# Patient Record
Sex: Male | Born: 1960 | Race: White | Hispanic: No | Marital: Married | Smoking: Never smoker
Health system: Southern US, Community
[De-identification: ages and names within clinical notes are randomized; demographics above are authoritative.]

---

## 2005-07-24 ENCOUNTER — Encounter: Admission: RE | Admit: 2005-07-24 | Discharge: 2005-07-24 | Payer: Self-pay | Admitting: Neurology

## 2005-12-15 ENCOUNTER — Emergency Department: Payer: Self-pay | Admitting: General Practice

## 2006-11-21 IMAGING — CR DG HUMERUS 2V *R*
3 series · 3 of 3 positions shown · non-contrast
Comparison: none

CLINICAL DATA: Pain in the upper arm, some numbness.
 RIGHT HUMERUS:
 Three views of the right humerus were obtained, including views of the elbow.  No acute bony abnormality is seen.  There is a small exostosis emanating from the distal right humerus.  There is mild degenerative change in the elbow.

[view not recorded (1 of 3)]
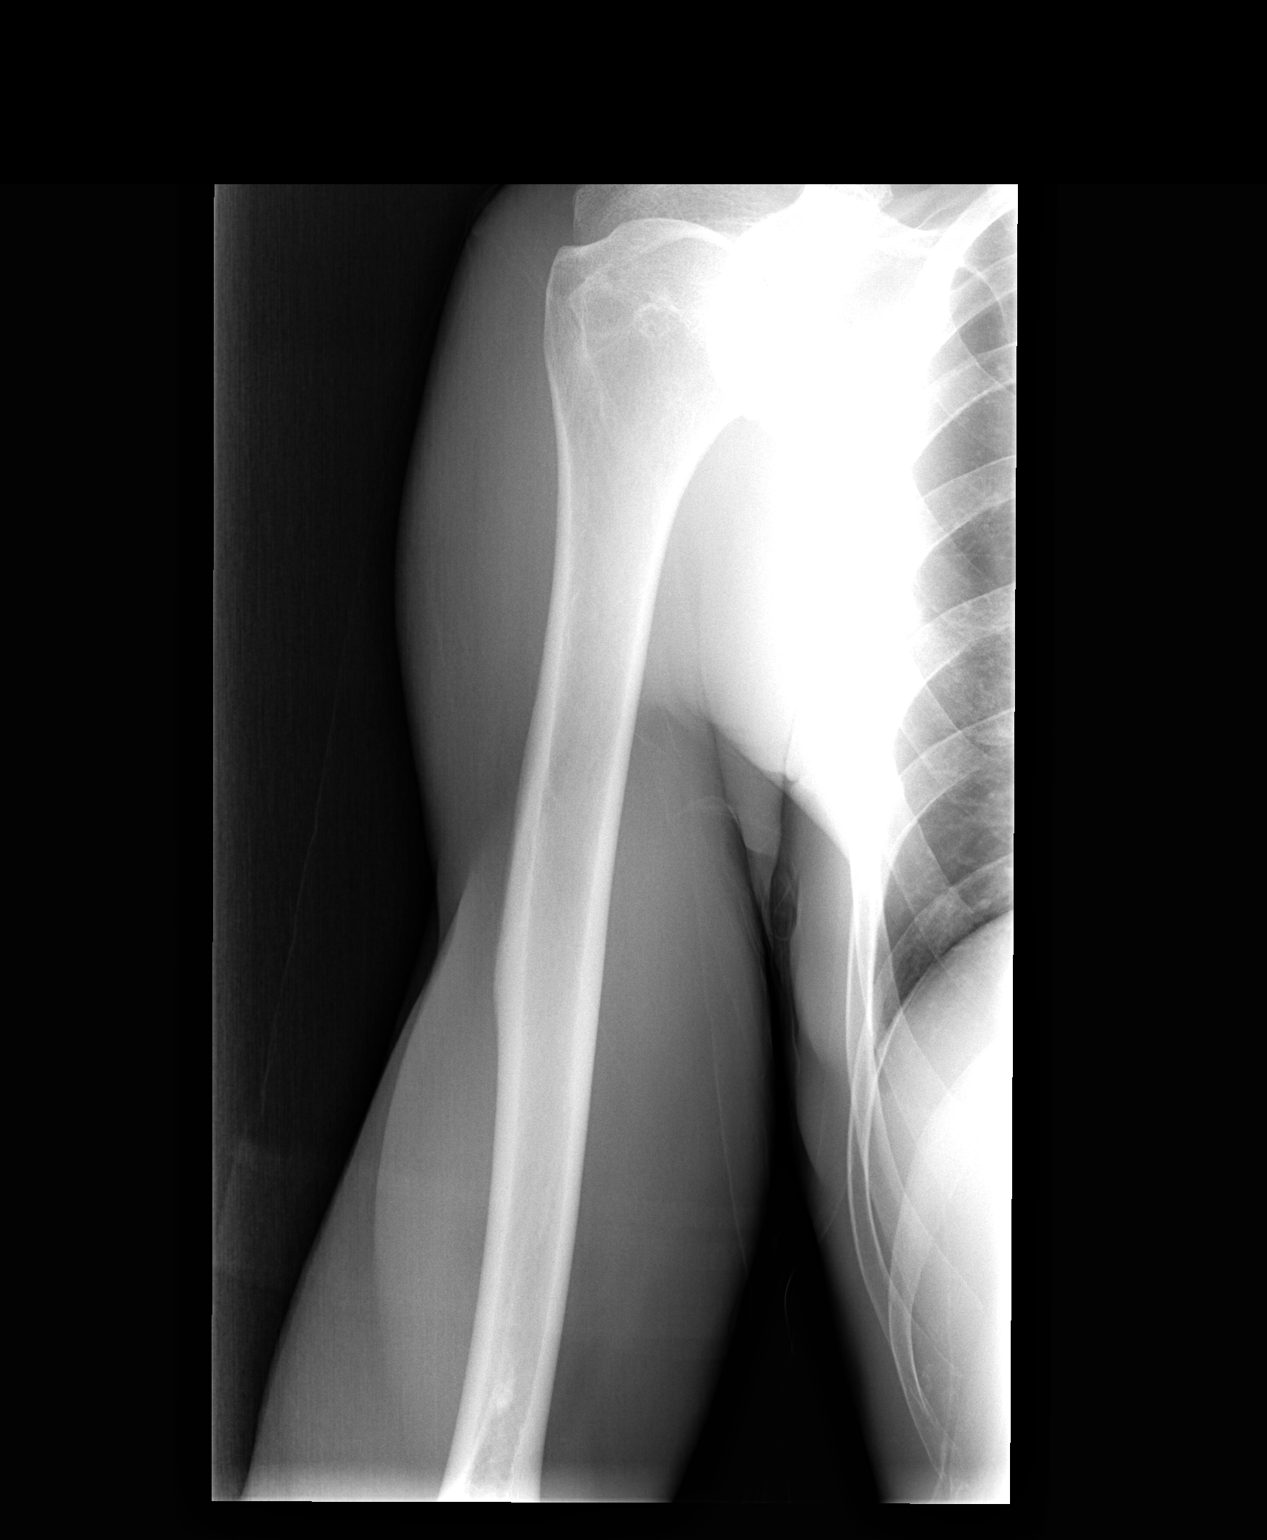

[view not recorded (2 of 3)]
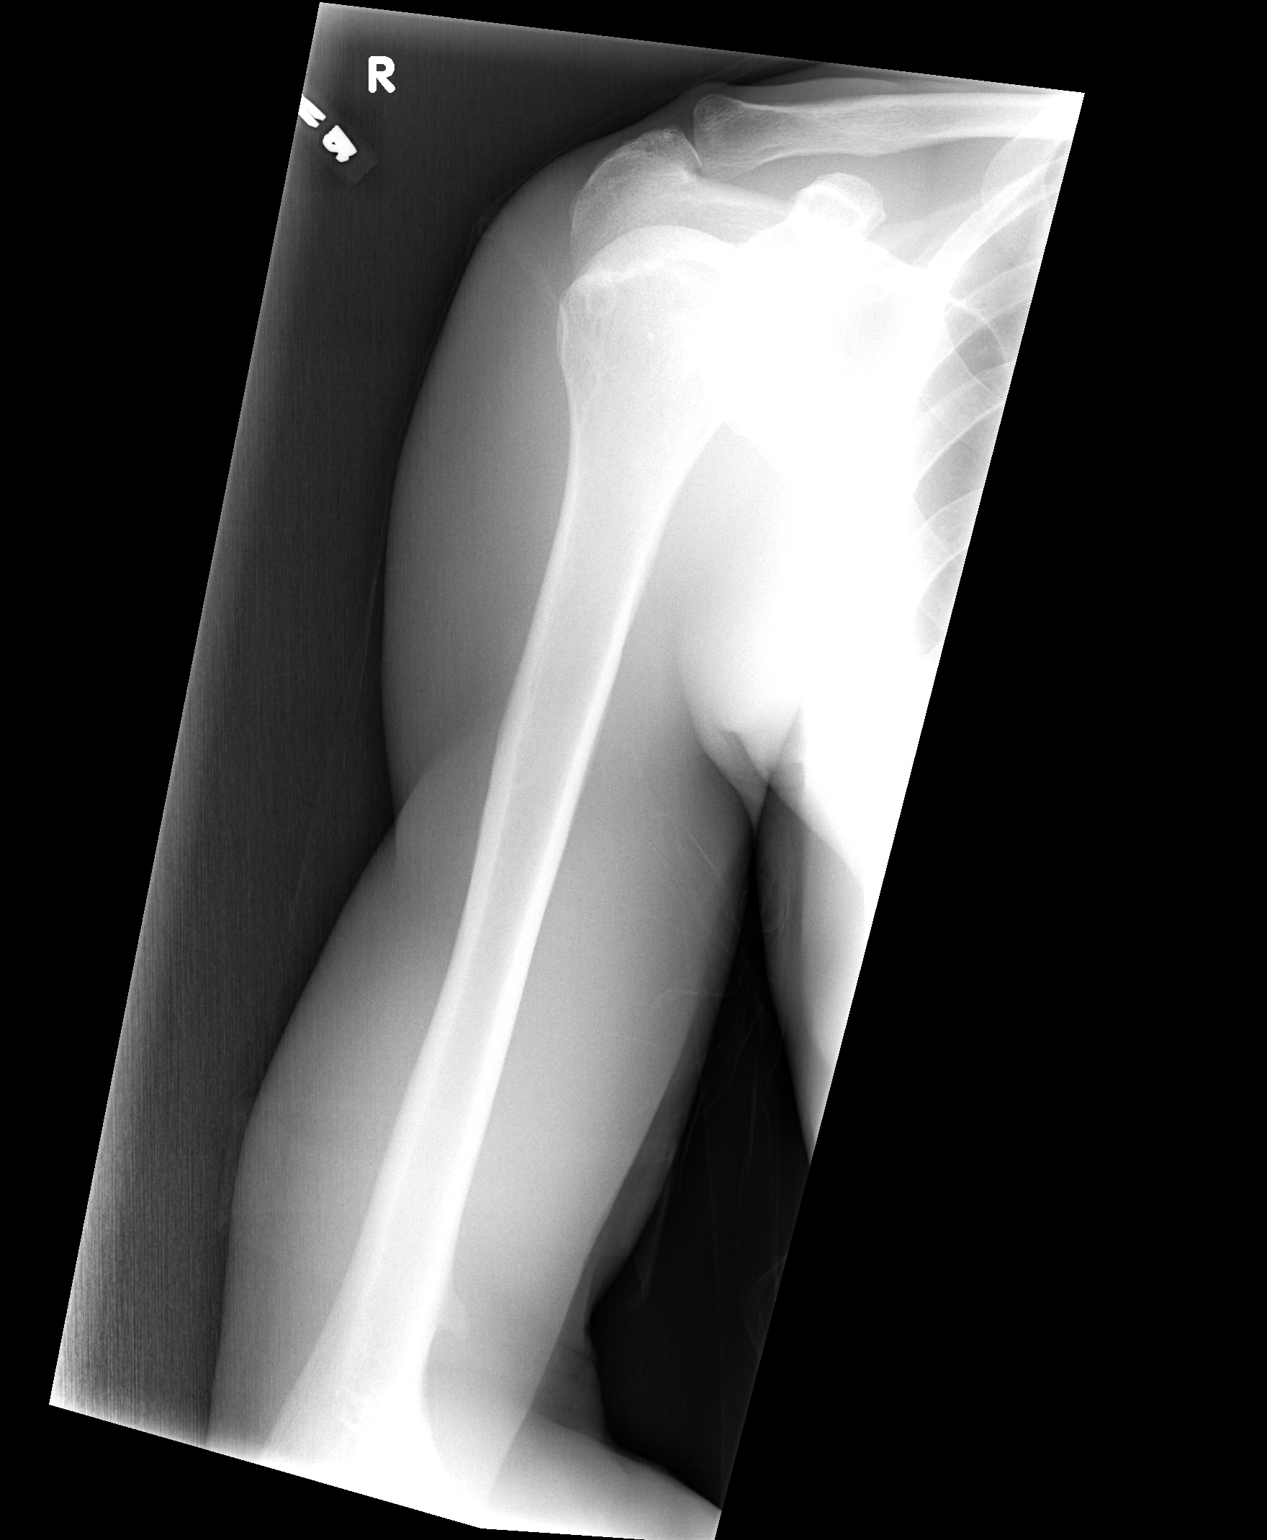

[view not recorded (3 of 3)]
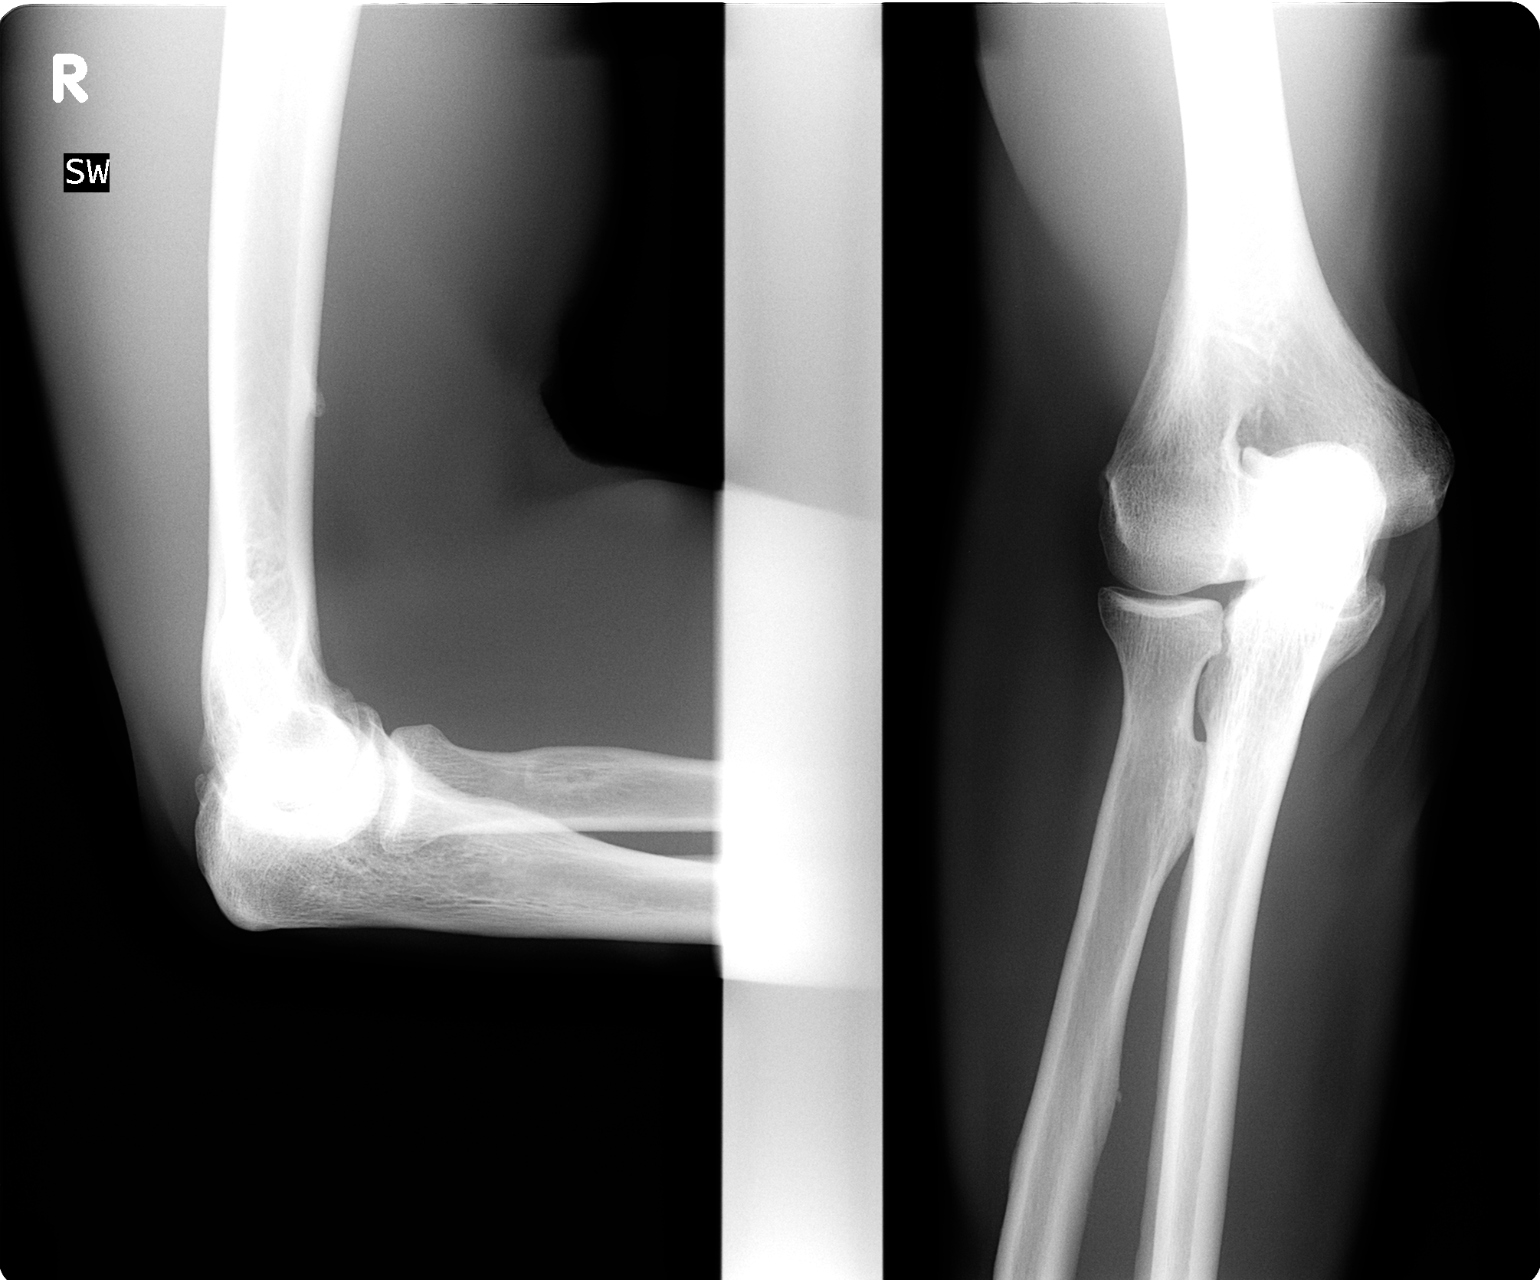

[3 of 3 positions shown; findings below may reference images not displayed]

IMPRESSION: Small bony exostosis emanates from the distal right humerus.  Mild degenerative change in the right elbow is noted.

## 2007-04-14 IMAGING — CR DG ANKLE COMPLETE 3+V*L*
1 series · 5 of 5 positions shown · non-contrast
Comparison: none

REASON FOR EXAM: Running injury - twisted
COMMENTS:

[Series 1: view not recorded · 0.17mm/px · 5 of 5 slices shown]
[im 1/5]
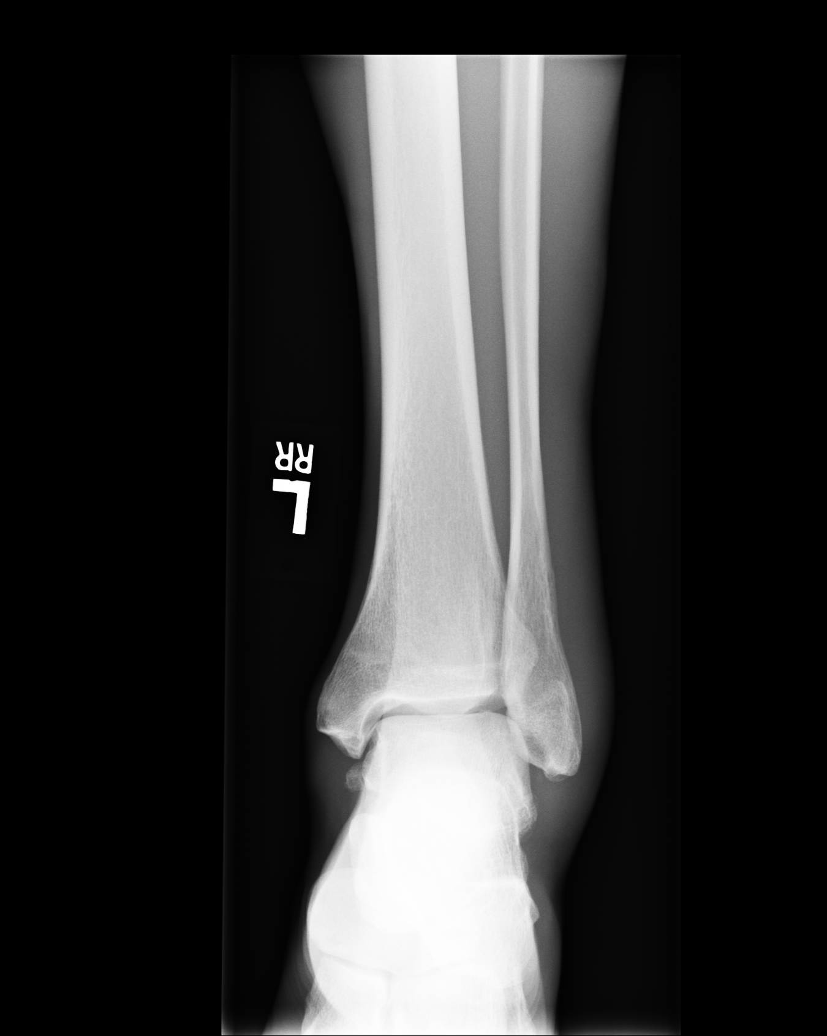
[im 2/5]
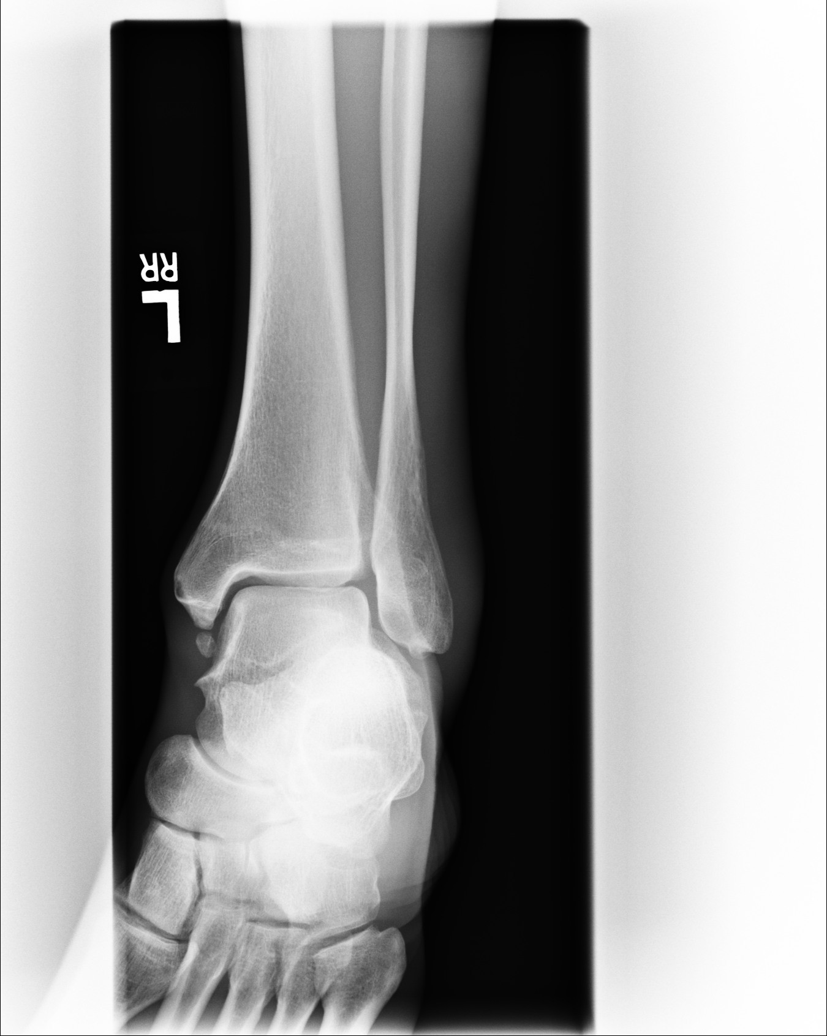
[im 3/5]
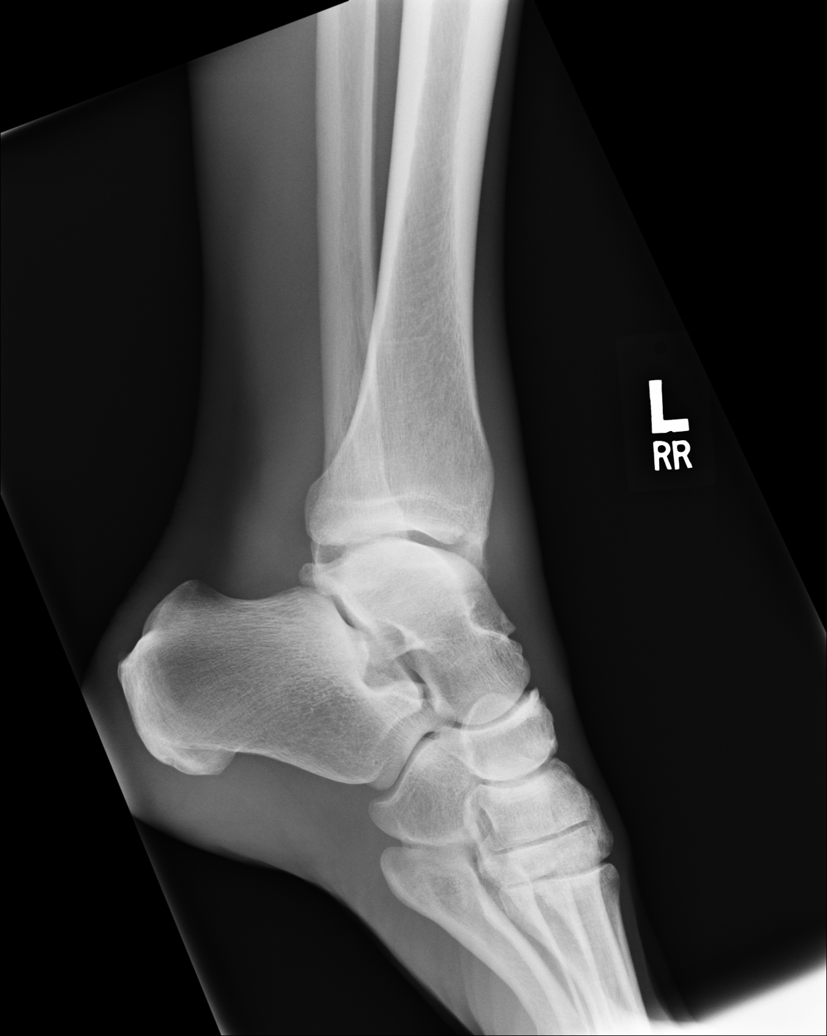
[im 4/5]
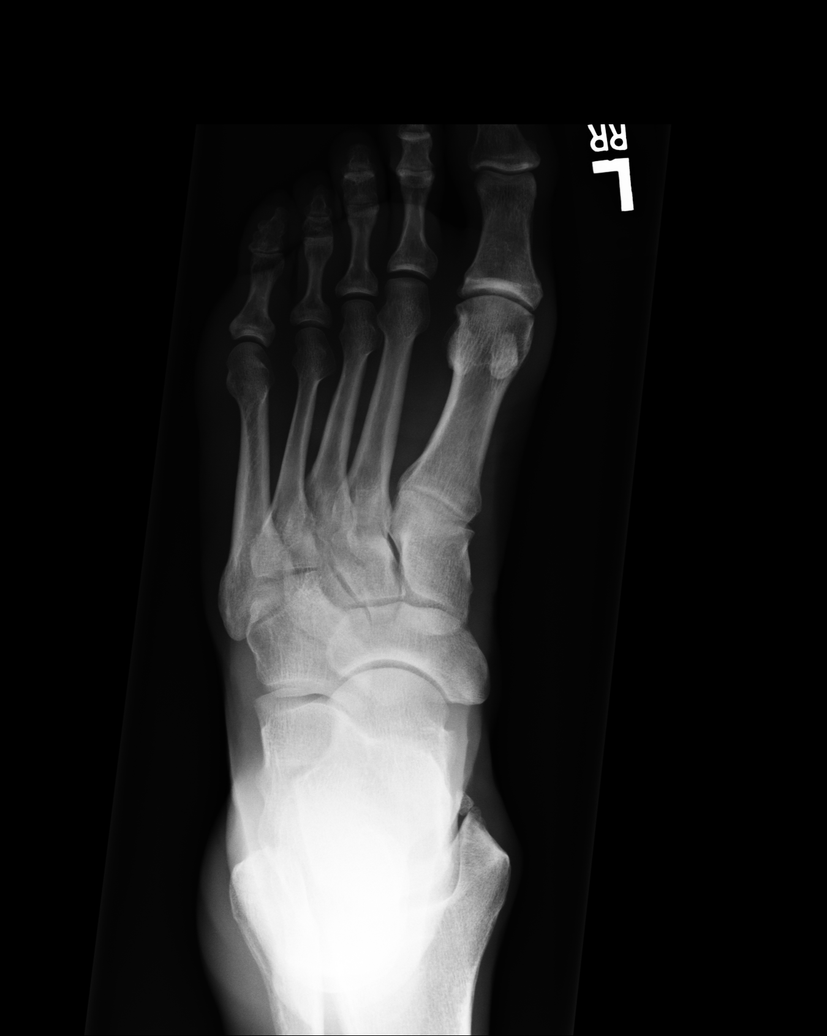
[im 5/5]
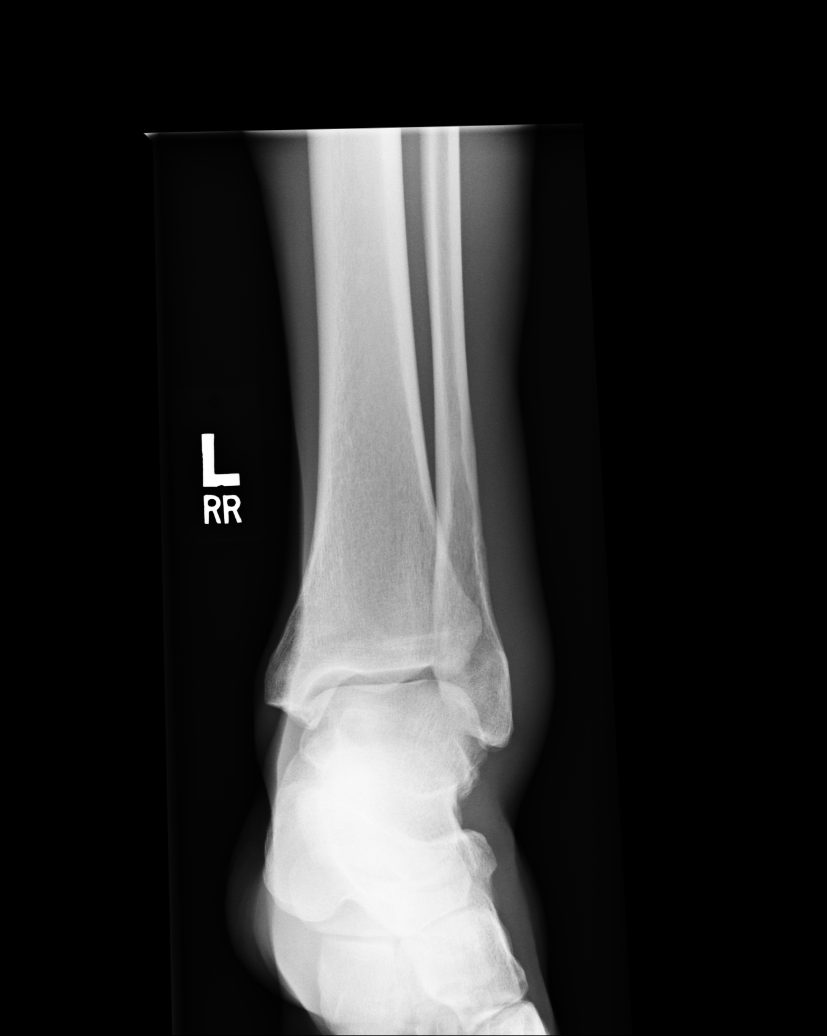

[5 of 5 positions shown; findings below may reference images not displayed]

PROCEDURE:     DXR - DXR ANKLE LEFT COMPLETE  - December 15, 2005  [DATE]

RESULT:       There is soft tissue swelling over the lateral malleolus.  The
ankle joint mortise is preserved.  There is bony density inferior to the
medial malleolus that likely reflects an old avulsion versus an accessory
ossicles.  The posterior malleolus appears intact.  The ankle joint mortise
is preserved.
IMPRESSION: 1.     I do not see evidence of acute fracture of the LEFT ankle.  There is
soft tissue swelling, however, which may indicate ligamentous injury.
Follow-up films are recommended if the patient's symptoms persist and do not
resolve in a manner consistent with an uncomplicated sprain.
2.     There is likely an old avulsion fracture along the medial malleolus.

## 2008-08-04 DIAGNOSIS — L570 Actinic keratosis: Secondary | ICD-10-CM

## 2008-08-04 HISTORY — DX: Actinic keratosis: L57.0

## 2013-10-21 LAB — HEPATIC FUNCTION PANEL
ALK PHOS: 51 U/L (ref 25–125)
ALT: 23 U/L (ref 10–40)
AST: 25 U/L (ref 14–40)
BILIRUBIN, TOTAL: 0.4 mg/dL

## 2013-10-21 LAB — BASIC METABOLIC PANEL
BUN: 21 mg/dL (ref 4–21)
Creatinine: 1 mg/dL (ref 0.6–1.3)
GLUCOSE: 84 mg/dL
POTASSIUM: 5.1 mmol/L (ref 3.4–5.3)
SODIUM: 139 mmol/L (ref 137–147)

## 2013-10-21 LAB — CBC AND DIFFERENTIAL
HCT: 42 % (ref 41–53)
HEMOGLOBIN: 14.8 g/dL (ref 13.5–17.5)
Neutrophils Absolute: 3 /uL
Platelets: 189 10*3/uL (ref 150–399)
WBC: 5.7 10^3/mL

## 2013-10-21 LAB — TSH: TSH: 2.83 u[IU]/mL (ref 0.41–5.90)

## 2013-10-21 LAB — PSA: PSA: 0.3

## 2013-10-21 LAB — LIPID PANEL
CHOLESTEROL: 175 mg/dL (ref 0–200)
HDL: 77 mg/dL — AB (ref 35–70)
LDL Cholesterol: 86 mg/dL
LDL/HDL RATIO: 1.1
TRIGLYCERIDES: 58 mg/dL (ref 40–160)

## 2014-01-03 ENCOUNTER — Ambulatory Visit: Payer: Self-pay | Admitting: Unknown Physician Specialty

## 2014-01-03 LAB — HM COLONOSCOPY

## 2014-01-05 LAB — PATHOLOGY REPORT

## 2014-10-08 DIAGNOSIS — F329 Major depressive disorder, single episode, unspecified: Secondary | ICD-10-CM | POA: Insufficient documentation

## 2014-10-08 DIAGNOSIS — L719 Rosacea, unspecified: Secondary | ICD-10-CM | POA: Insufficient documentation

## 2014-10-08 DIAGNOSIS — Z789 Other specified health status: Secondary | ICD-10-CM | POA: Insufficient documentation

## 2014-10-08 DIAGNOSIS — F32A Depression, unspecified: Secondary | ICD-10-CM | POA: Insufficient documentation

## 2014-10-08 DIAGNOSIS — E559 Vitamin D deficiency, unspecified: Secondary | ICD-10-CM | POA: Insufficient documentation

## 2014-10-08 DIAGNOSIS — K219 Gastro-esophageal reflux disease without esophagitis: Secondary | ICD-10-CM | POA: Insufficient documentation

## 2014-11-10 ENCOUNTER — Encounter: Payer: Self-pay | Admitting: Family Medicine

## 2014-11-25 ENCOUNTER — Ambulatory Visit (INDEPENDENT_AMBULATORY_CARE_PROVIDER_SITE_OTHER): Payer: BLUE CROSS/BLUE SHIELD | Admitting: Family Medicine

## 2014-11-25 ENCOUNTER — Encounter: Payer: Self-pay | Admitting: Family Medicine

## 2014-11-25 VITALS — BP 98/58 | HR 60 | Temp 97.8°F | Resp 16 | Ht 72.0 in | Wt 166.0 lb

## 2014-11-25 DIAGNOSIS — Z0000 Encounter for general adult medical examination without abnormal findings: Secondary | ICD-10-CM | POA: Diagnosis not present

## 2014-11-25 NOTE — Progress Notes (Signed)
Patient ID: Johnny Serrano, male   DOB: Feb 26, 1961, 54 y.o.   MRN: [id number]       Patient: Johnny Serrano, Male    DOB: 1961/02/04, 54 y.o.   MRN: [id number] Visit Date: 11/25/2014  Today's Provider: Wilhemena Durie, MD   Chief Complaint  Patient presents with  . Annual Exam   Subjective:    Annual physical exam Johnny Serrano is a 54 y.o. male who presents today for health maintenance and complete physical. He feels well. He reports exercising daily, usually running. He reports he is sleeping well.  ----------------------------------------------------------------- Colonoscopy-01/03/14 Polyps, diverticulosis, hemorrhoids EKG 01/03/11 Tdap 10/20/13    Review of Systems  Constitutional: Negative.   HENT: Negative.   Eyes: Negative.   Respiratory: Negative.   Cardiovascular: Negative.   Gastrointestinal: Negative.   Endocrine: Negative.   Genitourinary: Negative.   Musculoskeletal: Negative.   Skin: Negative.   Allergic/Immunologic: Negative.   Neurological: Negative.   Hematological: Negative.   Psychiatric/Behavioral: Negative.   All other systems reviewed and are negative.   Social History He   Social History   Social History  . Marital Status: Married    Spouse Name: N/A  . Number of Children: N/A  . Years of Education: N/A   Social History Main Topics  . Smoking status: Not on file  . Smokeless tobacco: Not on file  . Alcohol Use: Not on file  . Drug Use: Not on file  . Sexual Activity: Not on file   Other Topics Concern  . Not on file   Social History Narrative  . No narrative on file    Patient Active Problem List   Diagnosis Date Noted  . Problems influencing health status 10/08/2014  . Clinical depression 10/08/2014  . Acid reflux 10/08/2014  . 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 Avitaminosis D 10/08/2014    No past surgical history on file.  Family History  Family Status  Relation Status Death Age  . Mother Alive   . Father  Alive   . Sister Alive   . Daughter Alive   . Son Alive   . Daughter Alive    His family history includes Asthma in his daughter; Bradycardia in his son; COPD in his father; Hypotension in his father; Iron deficiency in his daughter; Osteoporosis in his father.    Allergies  Allergen Reactions  . Nsaids   . Shellfish Allergy Swelling    Can eat cooked shellfish but any raw shellfish causes swelling to area in contact    Previous Medications   CHOLECALCIFEROL 1000 UNITS CAPSULE    Take by mouth.   ESCITALOPRAM (LEXAPRO) 10 MG TABLET    Take by mouth.   IBUPROFEN (ADVIL) 200 MG TABLET    Take by mouth.   METRONIDAZOLE (METROGEL) 1 % GEL    METROGEL, 1% (External Gel)  1 as needed for 0 days  Refills: 12   Ordered :17-Apr-2012  Althea Charon ;  Started 17-Apr-2012 Active Comments: Medication taken as needed.     Patient Care Team: Jerrol Banana., MD as PCP - General (Family Medicine)     Objective:   Vitals: BP 98/58 mmHg  Pulse 60  Temp(Src) 97.8 F (36.6 C) (Oral)  Resp 16  Ht 6' (1.829 m)  Wt 166 lb (75.297 kg)  BMI 22.51 kg/m2   Physical Exam  Constitutional: He is oriented to person, place, and time. He appears well-developed and well-nourished.  HENT:  Head: Normocephalic and atraumatic.  Right Ear: External ear normal.  Left Ear: External ear normal.  Nose: Nose normal.  Mouth/Throat: Oropharynx is clear and moist.  Eyes: Conjunctivae and EOM are normal. Pupils are equal, round, and reactive to light.  Neck: Neck supple.  Cardiovascular: Normal rate, regular rhythm and normal heart sounds.   Pulmonary/Chest: Effort normal and breath sounds normal.  Abdominal: Soft. Bowel sounds are normal.  Genitourinary: Prostate normal and penis normal.  Musculoskeletal: Normal range of motion.  Neurological: He is alert and oriented to person, place, and time.  Skin: Skin is warm and dry.  Psychiatric: He has a normal mood and affect. His behavior is normal.  Judgment and thought content normal.   Patient had colonoscopy October of last year. DRE deferred until next year at [facility]   Depression Screen No flowsheet data found.    Assessment & Plan:     Routine Health Maintenance and Physical Exam  Exercise Activities and Dietary recommendations Goals    None      Immunization History  Administered Date(s) Administered  . Tdap 10/20/2013    Health Maintenance  Topic Date Due  . Hepatitis C Screening  07-17-1960  . HIV Screening  02/05/1974  . INFLUENZA VACCINE  10/31/2014  . TETANUS/TDAP  10/21/2023  . COLONOSCOPY  01/04/2024   I have done the exam and reviewed the above chart and it is accurate to the best of my knowledge.    Discussed health benefits of physical activity, and encouraged him to engage in regular exercise appropriate for his age and condition.    --------------------------------------------------------------------

## 2014-11-26 LAB — CBC WITH DIFFERENTIAL/PLATELET
BASOS: 1 %
Basophils Absolute: 0 10*3/uL (ref 0.0–0.2)
EOS (ABSOLUTE): 0.2 10*3/uL (ref 0.0–0.4)
EOS: 4 %
HEMATOCRIT: 42.6 % (ref 37.5–51.0)
Hemoglobin: 14.4 g/dL (ref 12.6–17.7)
Immature Grans (Abs): 0 10*3/uL (ref 0.0–0.1)
Immature Granulocytes: 0 %
LYMPHS ABS: 1.7 10*3/uL (ref 0.7–3.1)
Lymphs: 39 %
MCH: 30.7 pg (ref 26.6–33.0)
MCHC: 33.8 g/dL (ref 31.5–35.7)
MCV: 91 fL (ref 79–97)
MONOS ABS: 0.2 10*3/uL (ref 0.1–0.9)
Monocytes: 6 %
Neutrophils Absolute: 2.2 10*3/uL (ref 1.4–7.0)
Neutrophils: 50 %
Platelets: 190 10*3/uL (ref 150–379)
RBC: 4.69 x10E6/uL (ref 4.14–5.80)
RDW: 13.1 % (ref 12.3–15.4)
WBC: 4.4 10*3/uL (ref 3.4–10.8)

## 2014-11-26 LAB — LIPID PANEL WITH LDL/HDL RATIO
Cholesterol, Total: 184 mg/dL (ref 100–199)
HDL: 71 mg/dL (ref >39)
LDL Calculated: 103 mg/dL — ABNORMAL HIGH (ref 0–99)
LDL/HDL RATIO: 1.5 ratio (ref 0.0–3.6)
Triglycerides: 48 mg/dL (ref 0–149)
VLDL Cholesterol Cal: 10 mg/dL (ref 5–40)

## 2014-11-26 LAB — TSH: TSH: 1.94 u[IU]/mL (ref 0.450–4.500)

## 2014-11-26 LAB — PSA: Prostate Specific Ag, Serum: 0.3 ng/mL (ref 0.0–4.0)

## 2014-11-29 ENCOUNTER — Telehealth: Payer: Self-pay

## 2014-11-29 NOTE — Telephone Encounter (Signed)
Pt returned your call.  Thanks, Con Memos

## 2014-11-29 NOTE — Telephone Encounter (Signed)
Left message to call back  

## 2014-11-29 NOTE — Telephone Encounter (Signed)
-----   Message from Jerrol Banana., MD sent at 11/29/2014  1:57 PM EDT ----- Labs good.

## 2014-11-29 NOTE — Telephone Encounter (Signed)
Pt advised, CMET was not done spoke with Mardene Celeste at [facility] and she will get this added on it was their fault.-aa

## 2014-12-01 LAB — COMPREHENSIVE METABOLIC PANEL
A/G RATIO: 2 (ref 1.1–2.5)
ALBUMIN: 4.7 g/dL (ref 3.5–5.5)
ALT: 20 IU/L (ref 0–44)
AST: 19 IU/L (ref 0–40)
Alkaline Phosphatase: 48 IU/L (ref 39–117)
BUN / CREAT RATIO: 18 (ref 9–20)
BUN: 19 mg/dL (ref 6–24)
Bilirubin Total: 0.3 mg/dL (ref 0.0–1.2)
CALCIUM: 9.7 mg/dL (ref 8.7–10.2)
CO2: 25 mmol/L (ref 18–29)
Chloride: 102 mmol/L (ref 97–108)
Creatinine, Ser: 1.07 mg/dL (ref 0.76–1.27)
GFR, EST AFRICAN AMERICAN: 90 mL/min/{1.73_m2} (ref >59)
GFR, EST NON AFRICAN AMERICAN: 78 mL/min/{1.73_m2} (ref >59)
Globulin, Total: 2.3 g/dL (ref 1.5–4.5)
Glucose: 94 mg/dL (ref 65–99)
POTASSIUM: 5.8 mmol/L — AB (ref 3.5–5.2)
Sodium: 144 mmol/L (ref 134–144)
TOTAL PROTEIN: 7 g/dL (ref 6.0–8.5)

## 2014-12-01 LAB — SPECIMEN STATUS REPORT

## 2014-12-01 NOTE — Telephone Encounter (Signed)
[employer], wife of [facility] results-aa

## 2014-12-01 NOTE — Telephone Encounter (Signed)
Pt's wife wanted to know if we had got the other results back yet. Thanks TNP

## 2014-12-20 ENCOUNTER — Other Ambulatory Visit: Payer: Self-pay | Admitting: Family Medicine

## 2015-02-14 ENCOUNTER — Telehealth: Payer: Self-pay | Admitting: *Deleted

## 2015-02-14 NOTE — Telephone Encounter (Signed)
Johnny Serrano returned my call, patient cancelling todays treatment still not feeling well after yesterday's reaction to cehmotherapy infusion, will be back for treatment tomorrow, thanked this RN for calling earlier and checking on her husband 11:21 AM

## 2015-02-15 ENCOUNTER — Telehealth: Payer: Self-pay | Admitting: *Deleted

## 2015-02-15 NOTE — Telephone Encounter (Signed)
Called sons phone number instead of patient, he will ask his mom to give me a call cancelled appt again today

## 2015-02-17 ENCOUNTER — Inpatient Hospital Stay: Admission: RE | Admit: 2015-02-17 | Payer: Self-pay | Source: Ambulatory Visit | Admitting: Radiation Oncology

## 2015-02-17 ENCOUNTER — Ambulatory Visit: Payer: Self-pay | Admitting: Radiation Oncology

## 2015-03-28 ENCOUNTER — Other Ambulatory Visit: Payer: Self-pay | Admitting: Family Medicine

## 2015-08-31 DIAGNOSIS — D239 Other benign neoplasm of skin, unspecified: Secondary | ICD-10-CM

## 2015-08-31 HISTORY — DX: Other benign neoplasm of skin, unspecified: D23.9

## 2015-11-23 ENCOUNTER — Encounter: Payer: Self-pay | Admitting: Family Medicine

## 2015-11-23 ENCOUNTER — Ambulatory Visit (INDEPENDENT_AMBULATORY_CARE_PROVIDER_SITE_OTHER): Payer: BLUE CROSS/BLUE SHIELD | Admitting: Family Medicine

## 2015-11-23 VITALS — BP 116/74 | HR 58 | Temp 97.7°F | Resp 16 | Ht 72.0 in | Wt 176.0 lb

## 2015-11-23 DIAGNOSIS — Z1211 Encounter for screening for malignant neoplasm of colon: Secondary | ICD-10-CM | POA: Diagnosis not present

## 2015-11-23 DIAGNOSIS — Z125 Encounter for screening for malignant neoplasm of prostate: Secondary | ICD-10-CM

## 2015-11-23 DIAGNOSIS — Z0000 Encounter for general adult medical examination without abnormal findings: Secondary | ICD-10-CM | POA: Diagnosis not present

## 2015-11-23 LAB — POCT URINALYSIS DIPSTICK
BILIRUBIN UA: NEGATIVE
GLUCOSE UA: NEGATIVE
Ketones, UA: NEGATIVE
Leukocytes, UA: NEGATIVE
Nitrite, UA: NEGATIVE
Protein, UA: NEGATIVE
RBC UA: NEGATIVE
SPEC GRAV UA: 1.02
Urobilinogen, UA: 0.2
pH, UA: 6.5

## 2015-11-23 LAB — IFOBT (OCCULT BLOOD): IFOBT: NEGATIVE

## 2015-11-23 MED ORDER — IBUPROFEN 800 MG PO TABS: 800.0000 mg | ORAL_TABLET | Freq: Three times a day (TID) | ORAL | 6 refills | Status: DC | PRN

## 2015-11-23 NOTE — Progress Notes (Signed)
Patient: Johnny Serrano, Male    DOB: 1961-01-05, 55 y.o.   MRN: [id number] Visit Date: 11/23/2015  Today's Provider: Wilhemena Durie, MD   Chief Complaint  Patient presents with  . Annual Exam   Subjective:    Annual physical exam SHMUEL KEDROWSKI is a 55 y.o. male who presents today for health maintenance and complete physical. He feels well. He reports exercising every day. He reports he is sleeping well. Patient reports that he does wake up some nights to use the bathroom.   Colonoscopy- 01/03/2014. Benign polyps, hemorrhoids, diverticulosis, otherwise normal.  Hep C screening- 08/03/2012. Negative.     Review of Systems  Constitutional: Negative.   HENT: Negative.   Eyes: Negative.   Respiratory: Negative.   Cardiovascular: Negative.   Gastrointestinal: Negative.   Endocrine: Negative.   Genitourinary: Negative.   Musculoskeletal: Negative.   Skin: Negative.   Allergic/Immunologic: Negative.   Neurological: Negative.   Hematological: Negative.   Psychiatric/Behavioral: Positive for agitation. Negative for behavioral problems, confusion, decreased concentration, dysphoric mood, hallucinations, self-injury, sleep disturbance and suicidal ideas. The patient is not nervous/anxious and is not hyperactive.        Has had increased stress over the last few months, but not concerning.     Social History      He  reports that he has never smoked. He does not have any smokeless tobacco history on file. He reports that he drinks alcohol. He reports that he does not use drugs.       Social History   Social History  . Marital status: Married    Spouse name: N/A  . Number of children: N/A  . Years of education: N/A   Social History Main Topics  . Smoking status: Never Smoker  . Smokeless tobacco: None  . Alcohol use Yes     Comment: 7-8 beers per day. Stopped and now has 2-3 beers daily during the week and 7-8 on the weekends.  . Drug use: No  . Sexual activity:  Not Asked   Other Topics Concern  . None   Social History Narrative  . None    No past medical history on file.   Patient Active Problem List   Diagnosis Date Noted  . Problems influencing health status 10/08/2014  . Clinical depression 10/08/2014  . Acid reflux 10/08/2014  . 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 Avitaminosis D 10/08/2014    No past surgical history on file.  Family History        Family Status  Relation Status  . Mother Alive  . Father Deceased  . Sister Alive  . Daughter Alive  . Son Alive  . Daughter Alive        His family history includes Asthma in his daughter; Bradycardia in his son; COPD in his father; Cancer in his father; Hypotension in his father; Iron deficiency in his daughter; Osteoporosis in his father.    Allergies  Allergen Reactions  . Nsaids   . Shellfish Allergy Swelling    Can eat cooked shellfish but any raw shellfish causes swelling to area in contact    Current Meds  Medication Sig  . Cholecalciferol 1000 UNITS capsule Take by mouth.  . escitalopram (LEXAPRO) 10 MG tablet TAKE 1 TABLET BY MOUTH EVERY DAY  . ibuprofen (ADVIL,MOTRIN) 800 MG tablet Take 1 tablet (800 mg total) by mouth every 8 (eight) hours as needed.  . metroNIDAZOLE (METROGEL) 1 % gel APPLY  A SMALL AMOUNT TO AFFECTED AREA ONCE A DAY AS DIRECTED  . [DISCONTINUED] ibuprofen (ADVIL) 200 MG tablet Take by mouth.    Patient Care Team: Jerrol Banana., MD as PCP - General (Family Medicine)     Objective:   Vitals: BP 116/74 (BP Location: Right Arm, Patient Position: Sitting, Cuff Size: Normal)   Pulse (!) 58   Temp 97.7 F (36.5 C)   Resp 16   Ht 6' (1.829 m)   Wt 176 lb (79.8 kg)   BMI 23.87 kg/m    Physical Exam  Constitutional: He is oriented to person, place, and time. He appears well-developed and well-nourished.  HENT:  Head: Normocephalic and atraumatic.  Right Ear: External ear normal.  Left Ear: External ear normal.  Nose: Nose  normal.  Mouth/Throat: Oropharynx is clear and moist.  Eyes: Conjunctivae are normal. No scleral icterus.  Neck: Neck supple. No JVD present. No thyromegaly present.  Cardiovascular: Normal rate, regular rhythm, normal heart sounds and intact distal pulses.   Pulmonary/Chest: Effort normal and breath sounds normal.  Abdominal: Soft.  Genitourinary: Rectum normal, prostate normal and penis normal. Rectal exam shows guaiac negative stool.  Musculoskeletal: Normal range of motion. He exhibits no edema.  Lymphadenopathy:    He has no cervical adenopathy.  Neurological: He is alert and oriented to person, place, and time. No cranial nerve deficit. He exhibits normal muscle tone. Coordination normal.  Skin: Skin is warm and dry.  Psychiatric: He has a normal mood and affect. His behavior is normal. Judgment and thought content normal.       Assessment & Plan:     Routine Health Maintenance and Physical Exam  Exercise Activities and Dietary recommendations Goals    None      Immunization History  Administered Date(s) Administered  . Tdap 10/20/2013    Health Maintenance  Topic Date Due  . HIV Screening  02/05/1974  . INFLUENZA VACCINE  10/31/2015  . TETANUS/TDAP  10/21/2023  . COLONOSCOPY  01/04/2024  . Hepatitis C Screening  Completed      Discussed health benefits of physical activity, and encouraged him to engage in regular exercise appropriate for his age and condition.  Overall patient looks very good. He has increased stresses he is not enjoying his present job. During the week he does not drink more than 2-3 beers per day. We discussed this as a good healthy choice. As good. We'll obtain routine labs. Continue his Lexapro for the time being. He does have at least one actinic keratosis on the top of his head. He has yearly follow-up with Dr. Evorn Gong. I have done the exam and reviewed the above chart and it is accurate to the best of my knowledge.        Shanyah Gattuso  Cranford Mon, MD  [facility]

## 2015-11-28 ENCOUNTER — Encounter: Payer: BLUE CROSS/BLUE SHIELD | Admitting: Family Medicine

## 2015-11-28 LAB — COMPREHENSIVE METABOLIC PANEL
A/G RATIO: 1.9 (ref 1.2–2.2)
ALT: 24 IU/L (ref 0–44)
AST: 24 IU/L (ref 0–40)
Albumin: 4.6 g/dL (ref 3.5–5.5)
Alkaline Phosphatase: 46 IU/L (ref 39–117)
BUN / CREAT RATIO: 15 (ref 9–20)
BUN: 15 mg/dL (ref 6–24)
Bilirubin Total: 0.5 mg/dL (ref 0.0–1.2)
CALCIUM: 9.4 mg/dL (ref 8.7–10.2)
CO2: 27 mmol/L (ref 18–29)
Chloride: 99 mmol/L (ref 96–106)
Creatinine, Ser: 0.98 mg/dL (ref 0.76–1.27)
GFR calc Af Amer: 99 mL/min/{1.73_m2} (ref >59)
GFR, EST NON AFRICAN AMERICAN: 86 mL/min/{1.73_m2} (ref >59)
GLOBULIN, TOTAL: 2.4 g/dL (ref 1.5–4.5)
Glucose: 86 mg/dL (ref 65–99)
POTASSIUM: 5.4 mmol/L — AB (ref 3.5–5.2)
SODIUM: 137 mmol/L (ref 134–144)
Total Protein: 7 g/dL (ref 6.0–8.5)

## 2015-11-28 LAB — CBC WITH DIFFERENTIAL/PLATELET
BASOS: 1 %
Basophils Absolute: 0 10*3/uL (ref 0.0–0.2)
EOS (ABSOLUTE): 0.1 10*3/uL (ref 0.0–0.4)
EOS: 3 %
HEMATOCRIT: 42.2 % (ref 37.5–51.0)
Hemoglobin: 14.6 g/dL (ref 12.6–17.7)
IMMATURE GRANULOCYTES: 0 %
Immature Grans (Abs): 0 10*3/uL (ref 0.0–0.1)
LYMPHS ABS: 1.5 10*3/uL (ref 0.7–3.1)
Lymphs: 35 %
MCH: 30.1 pg (ref 26.6–33.0)
MCHC: 34.6 g/dL (ref 31.5–35.7)
MCV: 87 fL (ref 79–97)
MONOS ABS: 0.2 10*3/uL (ref 0.1–0.9)
Monocytes: 6 %
Neutrophils Absolute: 2.3 10*3/uL (ref 1.4–7.0)
Neutrophils: 55 %
Platelets: 189 10*3/uL (ref 150–379)
RBC: 4.85 x10E6/uL (ref 4.14–5.80)
RDW: 13.9 % (ref 12.3–15.4)
WBC: 4.1 10*3/uL (ref 3.4–10.8)

## 2015-11-28 LAB — LIPID PANEL
CHOL/HDL RATIO: 2.6 ratio (ref 0.0–5.0)
Cholesterol, Total: 187 mg/dL (ref 100–199)
HDL: 73 mg/dL (ref >39)
LDL Calculated: 104 mg/dL — ABNORMAL HIGH (ref 0–99)
TRIGLYCERIDES: 50 mg/dL (ref 0–149)
VLDL Cholesterol Cal: 10 mg/dL (ref 5–40)

## 2015-11-28 LAB — PSA: PROSTATE SPECIFIC AG, SERUM: 0.3 ng/mL (ref 0.0–4.0)

## 2015-11-28 LAB — TSH: TSH: 3.03 u[IU]/mL (ref 0.450–4.500)

## 2016-01-07 ENCOUNTER — Other Ambulatory Visit: Payer: Self-pay | Admitting: Family Medicine

## 2016-08-05 ENCOUNTER — Other Ambulatory Visit: Payer: Self-pay | Admitting: Family Medicine

## 2016-09-02 ENCOUNTER — Telehealth: Payer: Self-pay | Admitting: Family Medicine

## 2016-09-02 ENCOUNTER — Other Ambulatory Visit: Payer: Self-pay | Admitting: Family Medicine

## 2016-09-02 MED ORDER — IBUPROFEN 800 MG PO TABS: 800.0000 mg | ORAL_TABLET | Freq: Three times a day (TID) | ORAL | 3 refills | Status: DC | PRN

## 2016-09-02 MED ORDER — ESCITALOPRAM OXALATE 10 MG PO TABS: 10.0000 mg | ORAL_TABLET | Freq: Every day | ORAL | 3 refills | Status: DC

## 2016-09-02 NOTE — Addendum Note (Signed)
Addended by: Arnette Norris on: 09/02/2016 11:36 AM   Modules accepted: Orders

## 2016-09-02 NOTE — Telephone Encounter (Addendum)
Error/MW °

## 2016-09-02 NOTE — Telephone Encounter (Signed)
Sent same as sent on 08/06/16.

## 2016-09-02 NOTE — Telephone Encounter (Addendum)
[facility]ed a [facility] refill request on the following medications:  ibuprofen (ADVIL,MOTRIN) 800 MG tablet.  Take 1 tablet by mouth every 8 hours as needed.   90 day supply/270.0 tablets.    escitalopram (LEXAPRO) 10 MG tablet.  Take 1 tablet by mouth every day.  90 day supply.    [facility] [place]

## 2016-11-27 ENCOUNTER — Encounter: Payer: BLUE CROSS/BLUE SHIELD | Admitting: Family Medicine

## 2016-12-12 ENCOUNTER — Encounter: Payer: BLUE CROSS/BLUE SHIELD | Admitting: Family Medicine

## 2016-12-18 ENCOUNTER — Encounter: Payer: Self-pay | Admitting: Family Medicine

## 2016-12-19 ENCOUNTER — Encounter: Payer: Self-pay | Admitting: Family Medicine

## 2016-12-19 ENCOUNTER — Ambulatory Visit (INDEPENDENT_AMBULATORY_CARE_PROVIDER_SITE_OTHER): Payer: BLUE CROSS/BLUE SHIELD | Admitting: Family Medicine

## 2016-12-19 VITALS — BP 116/72 | HR 50 | Temp 97.4°F | Resp 14 | Ht 72.0 in | Wt 172.0 lb

## 2016-12-19 DIAGNOSIS — Z0000 Encounter for general adult medical examination without abnormal findings: Secondary | ICD-10-CM

## 2016-12-19 DIAGNOSIS — Z1211 Encounter for screening for malignant neoplasm of colon: Secondary | ICD-10-CM | POA: Diagnosis not present

## 2016-12-19 DIAGNOSIS — Z125 Encounter for screening for malignant neoplasm of prostate: Secondary | ICD-10-CM | POA: Diagnosis not present

## 2016-12-19 NOTE — Progress Notes (Signed)
Patient: Johnny Serrano, Male    DOB: 1960/10/27, 56 y.o.   MRN: [id number] Visit Date: 12/19/2016  Today's Provider: Wilhemena Durie, MD   Chief Complaint  Patient presents with  . Annual Exam   Subjective:    Annual physical exam Johnny Serrano is a 56 y.o. male who presents today for health maintenance and complete physical. He feels well. He reports exercising about daily. He reports he is sleeping well. Pt married with 3 children,2 are married,no grandchilren. 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  -----------------------------------------------------------------  Colonoscopy- 01/03/14 Dr. Vira Agar, polyps, diverticulosis, and internal hemorrhoids otherwise normal   Immunization History  Administered Date(s) Administered  . Tdap 10/20/2013     Review of Systems  Constitutional: Negative.   HENT: Negative.   Eyes: Negative.   Respiratory: Negative.   Cardiovascular: Negative.   Gastrointestinal: Negative.   Endocrine: Negative.   Genitourinary: Negative.   Musculoskeletal: Positive for arthralgias.  Skin: Negative.   Allergic/Immunologic: Negative.   Neurological: Negative.   Hematological: Negative.   Psychiatric/Behavioral: Positive for sleep disturbance.    Social History      He  reports that he has never smoked. He has never used smokeless tobacco. He reports that he drinks alcohol. He reports that he does not use drugs.       Social History   Social History  . Marital status: Married    Spouse name: N/A  . Number of children: N/A  . Years of education: N/A   Social History Main Topics  . Smoking status: Never Smoker  . Smokeless tobacco: Never Used  . Alcohol use Yes     Comment: 7-8 beers per day. Stopped and now has 2-3 beers daily during the week and 7-8 on the weekends.  . Drug use: No  . Sexual activity: Not Asked   Other Topics Concern  . None   Social History Narrative  .  None    History reviewed. No pertinent past medical history.   Patient Active Problem List   Diagnosis Date Noted  . Problems influencing health status 10/08/2014  . Clinical depression 10/08/2014  . Acid reflux 10/08/2014  . 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 Avitaminosis D 10/08/2014    History reviewed. No pertinent surgical history.  Family History        Family Status  Relation Status  . Mother Alive  . Father Deceased  . Sister Alive  . Daughter Alive  . Son Alive  . Daughter Alive        His family history includes Asthma in his daughter; Bradycardia in his son; COPD in his father; Cancer in his father; Hypotension in his father; Iron deficiency in his daughter; Osteoporosis in his father.     Allergies  Allergen Reactions  . Nsaids   . Shellfish Allergy Swelling    Can eat cooked shellfish but any raw shellfish causes swelling to area in contact     Current Outpatient Prescriptions:  .  Cholecalciferol 1000 UNITS capsule, Take by mouth., Disp: , Rfl:  .  escitalopram (LEXAPRO) 10 MG tablet, Take 1 tablet (10 mg total) by mouth daily., Disp: 90 tablet, Rfl: 3 .  ibuprofen (ADVIL,MOTRIN) 800 MG tablet, Take 1 tablet (800 mg total) by mouth every 8 (eight) hours as needed., Disp: 270 tablet, Rfl: 3 .  metroNIDAZOLE (METROGEL) 1 % gel, APPLY A SMALL AMOUNT TO AFFECTED AREA  ONCE A DAY AS DIRECTED, Disp: 60 g, Rfl: 2   Patient Care Team: Jerrol Banana., MD as PCP - General (Family Medicine)      Objective:   Vitals: BP 116/72 (BP Location: Left Arm, Patient Position: Sitting, Cuff Size: Normal)   Pulse (!) 50   Temp (!) 97.4 F (36.3 C) (Oral)   Resp 14   Ht 6' (1.829 m)   Wt 172 lb (78 kg)   BMI 23.33 kg/m    Vitals:   12/19/16 1012  BP: 116/72  Pulse: (!) 50  Resp: 14  Temp: (!) 97.4 F (36.3 C)  TempSrc: Oral  Weight: 172 lb (78 kg)  Height: 6' (1.829 m)     Physical Exam  Constitutional: He is oriented to person, place, and time. He  appears well-developed and well-nourished.  HENT:  Head: Normocephalic and atraumatic.  Right Ear: External ear normal.  Left Ear: External ear normal.  Nose: Nose normal.  Mouth/Throat: Oropharynx is clear and moist.  Eyes: Pupils are equal, round, and reactive to light. Conjunctivae and EOM are normal.  Neck: Normal range of motion. Neck supple.  Cardiovascular: Normal rate, regular rhythm, normal heart sounds and intact distal pulses.   Pulmonary/Chest: Effort normal and breath sounds normal.  Abdominal: Soft. Bowel sounds are normal.  Genitourinary: Rectum normal, prostate normal and penis normal.  Musculoskeletal: Normal range of motion.  Neurological: He is alert and oriented to person, place, and time. He has normal reflexes.  Skin: Skin is warm and dry.  Psychiatric: He has a normal mood and affect. His behavior is normal. Judgment and thought content normal.     Depression Screen PHQ 2/9 Scores 12/19/2016  PHQ - 2 Score 1  PHQ- 9 Score 5      Assessment & Plan:     Routine Health Maintenance and Physical Exam  Exercise Activities and Dietary recommendations Goals    None      Immunization History  Administered Date(s) Administered  . Tdap 10/20/2013    Health Maintenance  Topic Date Due  . HIV Screening  02/05/1974  . INFLUENZA VACCINE  12/19/2017 (Originally 10/30/2016)  . TETANUS/TDAP  10/21/2023  . COLONOSCOPY  01/04/2024  . Hepatitis C Screening  Completed     Discussed health benefits of physical activity, and encouraged him to engage in regular exercise appropriate for his age and condition.  Good health--RTC 1 year.   --------------------------------------------------------------------   I have done the exam and reviewed the above chart and it is accurate to the best of my knowledge. Development worker, community has been used in this note in any air is in the dictation or transcription are unintentional.  Wilhemena Durie, MD  [place]

## 2017-01-02 LAB — CBC WITH DIFFERENTIAL/PLATELET
BASOS ABS: 0 10*3/uL (ref 0.0–0.2)
Basos: 1 %
EOS (ABSOLUTE): 0.2 10*3/uL (ref 0.0–0.4)
Eos: 4 %
Hematocrit: 45.8 % (ref 37.5–51.0)
Hemoglobin: 15 g/dL (ref 13.0–17.7)
IMMATURE GRANS (ABS): 0 10*3/uL (ref 0.0–0.1)
Immature Granulocytes: 0 %
LYMPHS ABS: 1.6 10*3/uL (ref 0.7–3.1)
LYMPHS: 33 %
MCH: 30.4 pg (ref 26.6–33.0)
MCHC: 32.8 g/dL (ref 31.5–35.7)
MCV: 93 fL (ref 79–97)
MONOS ABS: 0.3 10*3/uL (ref 0.1–0.9)
Monocytes: 5 %
NEUTROS ABS: 2.8 10*3/uL (ref 1.4–7.0)
Neutrophils: 57 %
PLATELETS: 197 10*3/uL (ref 150–379)
RBC: 4.94 x10E6/uL (ref 4.14–5.80)
RDW: 13.6 % (ref 12.3–15.4)
WBC: 4.9 10*3/uL (ref 3.4–10.8)

## 2017-01-02 LAB — LIPID PANEL WITH LDL/HDL RATIO
CHOLESTEROL TOTAL: 196 mg/dL (ref 100–199)
HDL: 73 mg/dL (ref >39)
LDL Calculated: 106 mg/dL — ABNORMAL HIGH (ref 0–99)
LDl/HDL Ratio: 1.5 ratio (ref 0.0–3.6)
Triglycerides: 83 mg/dL (ref 0–149)
VLDL Cholesterol Cal: 17 mg/dL (ref 5–40)

## 2017-01-02 LAB — COMPREHENSIVE METABOLIC PANEL
ALT: 30 IU/L (ref 0–44)
AST: 29 IU/L (ref 0–40)
Albumin/Globulin Ratio: 1.7 (ref 1.2–2.2)
Albumin: 4.6 g/dL (ref 3.5–5.5)
Alkaline Phosphatase: 50 IU/L (ref 39–117)
BUN/Creatinine Ratio: 12 (ref 9–20)
BUN: 13 mg/dL (ref 6–24)
Bilirubin Total: 0.5 mg/dL (ref 0.0–1.2)
CO2: 26 mmol/L (ref 20–29)
Calcium: 9.5 mg/dL (ref 8.7–10.2)
Chloride: 105 mmol/L (ref 96–106)
Creatinine, Ser: 1.09 mg/dL (ref 0.76–1.27)
GFR calc Af Amer: 87 mL/min/{1.73_m2} (ref >59)
GFR calc non Af Amer: 75 mL/min/{1.73_m2} (ref >59)
Globulin, Total: 2.7 g/dL (ref 1.5–4.5)
Glucose: 101 mg/dL — ABNORMAL HIGH (ref 65–99)
Potassium: 5.1 mmol/L (ref 3.5–5.2)
Sodium: 143 mmol/L (ref 134–144)
Total Protein: 7.3 g/dL (ref 6.0–8.5)

## 2017-01-02 LAB — TSH: TSH: 2.6 u[IU]/mL (ref 0.450–4.500)

## 2017-01-02 LAB — PSA: Prostate Specific Ag, Serum: 0.3 ng/mL (ref 0.0–4.0)

## 2017-06-16 ENCOUNTER — Other Ambulatory Visit: Payer: Self-pay | Admitting: Family Medicine

## 2017-06-16 MED ORDER — METRONIDAZOLE 1 % EX GEL: CUTANEOUS | 5 refills | Status: DC

## 2017-06-16 NOTE — Telephone Encounter (Signed)
[facility] the following medication. Thanks CC  metroNIDAZOLE (METROGEL) 1 % gel

## 2017-09-14 ENCOUNTER — Other Ambulatory Visit: Payer: Self-pay | Admitting: Family Medicine

## 2017-12-25 ENCOUNTER — Ambulatory Visit: Payer: BLUE CROSS/BLUE SHIELD | Admitting: Family Medicine

## 2017-12-25 ENCOUNTER — Encounter: Payer: Self-pay | Admitting: Family Medicine

## 2017-12-25 VITALS — BP 122/70 | HR 63 | Temp 98.2°F | Resp 15 | Ht 70.0 in | Wt 172.0 lb

## 2017-12-25 DIAGNOSIS — Z125 Encounter for screening for malignant neoplasm of prostate: Secondary | ICD-10-CM | POA: Diagnosis not present

## 2017-12-25 DIAGNOSIS — Z0000 Encounter for general adult medical examination without abnormal findings: Secondary | ICD-10-CM | POA: Diagnosis not present

## 2017-12-25 DIAGNOSIS — Z23 Encounter for immunization: Secondary | ICD-10-CM

## 2017-12-25 LAB — POCT URINALYSIS DIPSTICK
Bilirubin, UA: NEGATIVE
Glucose, UA: NEGATIVE
Ketones, UA: NEGATIVE
LEUKOCYTES UA: NEGATIVE
NITRITE UA: NEGATIVE
PH UA: 8.5 — AB (ref 5.0–8.0)
PROTEIN UA: NEGATIVE
RBC UA: NEGATIVE
UROBILINOGEN UA: 0.2 U/dL

## 2017-12-25 NOTE — Progress Notes (Signed)
Patient: Johnny Serrano, Male    DOB: 10/21/1960, 57 y.o.   MRN: [id number] Visit Date: 12/25/2017  Today's Provider: Wilhemena Durie, MD   Chief Complaint  Patient presents with  . Annual Exam   Subjective:    Annual physical exam Johnny Serrano is a 57 y.o. male who presents today for health maintenance and complete physical. He feels well. He reports exercising 5-6 a week. He reports he is sleeping well. He and his wife live in [place].His 3 children are here in [place]. He only drinks beer on weekends now. Exercises daily.  Last reported: Tdap- 10/20/13   Colonoscopy- 01/03/14 -----------------------------------------------------------------   Review of Systems  Constitutional: Negative.   HENT: Negative.   Eyes: Negative.   Respiratory: Negative.   Cardiovascular: Negative.   Gastrointestinal: Negative.   Endocrine: Negative.   Genitourinary: Negative.   Skin: Negative.   Allergic/Immunologic: Negative.   Neurological: Negative.   Hematological: Negative.   All other systems reviewed and are negative.   Social History He  reports that he has never smoked. He has never used smokeless tobacco. He reports that he drinks alcohol. He reports that he does not use drugs. Social History   Socioeconomic History  . Marital status: Married    Spouse name: Not on file  . Number of children: Not on file  . Years of education: Not on file  . Highest education level: Not on file  Occupational History  . Not on file  Social Needs  . Financial resource strain: Not on file  . Food insecurity:    Worry: Not on file    Inability: Not on file  . Transportation needs:    Medical: Not on file    Non-medical: Not on file  Tobacco Use  . Smoking status: Never Smoker  . Smokeless tobacco: Never Used  Substance and Sexual Activity  . Alcohol use: Yes    Comment: 7-8 beers per day. Stopped and now has 2-3 beers daily during the week and 7-8 on the  weekends.  . Drug use: No  . Sexual activity: Not on file  Lifestyle  . Physical activity:    Days per week: Not on file    Minutes per session: Not on file  . Stress: Not on file  Relationships  . Social connections:    Talks on phone: Not on file    Gets together: Not on file    Attends religious service: Not on file    Active member of club or organization: Not on file    Attends meetings of clubs or organizations: Not on file    Relationship status: Not on file  Other Topics Concern  . Not on file  Social History Narrative  . Not on file    Patient Active Problem List   Diagnosis Date Noted  . Problems influencing health status 10/08/2014  . Clinical depression 10/08/2014  . Acid reflux 10/08/2014  . 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 Avitaminosis D 10/08/2014    History reviewed. No pertinent surgical history.  Family History  Family Status  Relation Name Status  . Mother  Alive  . Father  Deceased  . Sister  Alive  . Daughter  Alive  . Son  Alive  . Daughter  Alive   His family history includes Asthma in his daughter; Bradycardia in his son; COPD in his father; Cancer in his father; Hypotension in his father; Iron deficiency in his  daughter; Osteoporosis in his father.     Allergies  Allergen Reactions  . Nsaids   . Shellfish Allergy Swelling    Can eat cooked shellfish but any raw shellfish causes swelling to area in contact    Previous Medications   CHOLECALCIFEROL 1000 UNITS CAPSULE    Take by mouth.   ESCITALOPRAM (LEXAPRO) 10 MG TABLET    TAKE 1 TABLET BY MOUTH EVERY DAY   IBUPROFEN (ADVIL,MOTRIN) 800 MG TABLET    Take 1 tablet (800 mg total) by mouth every 8 (eight) hours as needed.    Patient Care Team: Jerrol Banana., MD as PCP - General (Family Medicine)      Objective:   Vitals: BP 122/70   Pulse 63   Temp 98.2 F (36.8 C) (Oral)   Resp 15   Ht 5\' 10"  (1.778 m)   Wt 172 lb (78 kg)   SpO2 97%   BMI 24.68 kg/m    Physical  Exam  Constitutional: He is oriented to person, place, and time. He appears well-developed and well-nourished.  HENT:  Head: Normocephalic and atraumatic.  Right Ear: External ear normal.  Left Ear: External ear normal.  Nose: Nose normal.  Mouth/Throat: Oropharynx is clear and moist.  Eyes: Conjunctivae are normal. No scleral icterus.  Neck: No thyromegaly present.  Cardiovascular: Normal rate, regular rhythm, normal heart sounds and intact distal pulses.  Pulmonary/Chest: Effort normal and breath sounds normal.  Abdominal: Soft.  Musculoskeletal: He exhibits no edema.  Neurological: He is alert and oriented to person, place, and time.  Skin: Skin is warm and dry.  Psychiatric: He has a normal mood and affect. His behavior is normal. Judgment and thought content normal.     Depression Screen PHQ 2/9 Scores 12/25/2017 12/19/2016  PHQ - 2 Score 0 1  PHQ- 9 Score 0 5      Assessment & Plan:     Routine Health Maintenance and Physical Exam  Exercise Activities and Dietary recommendations Goals   None     Immunization History  Administered Date(s) Administered  . Influenza Split 01/22/2006  . Tdap 10/20/2013    Health Maintenance  Topic Date Due  . HIV Screening  02/05/1974  . INFLUENZA VACCINE  10/30/2017  . TETANUS/TDAP  10/21/2023  . COLONOSCOPY  01/04/2024  . Hepatitis C Screening  Completed     Discussed health benefits of physical activity, and encouraged him to engage in regular exercise appropriate for his age and condition.    -------------------------------------------------------------------- I have done the exam and reviewed the chart and it is accurate to the best of my knowledge. Development worker, community has been used and  any errors in dictation or transcription are unintentional. Miguel Aschoff M.D. [facility]

## 2017-12-26 LAB — COMPREHENSIVE METABOLIC PANEL
ALT: 30 IU/L (ref 0–44)
AST: 24 IU/L (ref 0–40)
Albumin/Globulin Ratio: 2 (ref 1.2–2.2)
Albumin: 4.7 g/dL (ref 3.5–5.5)
Alkaline Phosphatase: 59 IU/L (ref 39–117)
BUN/Creatinine Ratio: 15 (ref 9–20)
BUN: 16 mg/dL (ref 6–24)
Bilirubin Total: 0.5 mg/dL (ref 0.0–1.2)
CO2: 25 mmol/L (ref 20–29)
CREATININE: 1.04 mg/dL (ref 0.76–1.27)
Calcium: 9.6 mg/dL (ref 8.7–10.2)
Chloride: 100 mmol/L (ref 96–106)
GFR calc Af Amer: 91 mL/min/{1.73_m2} (ref >59)
GFR, EST NON AFRICAN AMERICAN: 79 mL/min/{1.73_m2} (ref >59)
GLOBULIN, TOTAL: 2.4 g/dL (ref 1.5–4.5)
Glucose: 85 mg/dL (ref 65–99)
Potassium: 4.7 mmol/L (ref 3.5–5.2)
Sodium: 141 mmol/L (ref 134–144)
Total Protein: 7.1 g/dL (ref 6.0–8.5)

## 2017-12-26 LAB — CBC WITH DIFFERENTIAL/PLATELET
Basophils Absolute: 0.1 10*3/uL (ref 0.0–0.2)
Basos: 1 %
EOS (ABSOLUTE): 0.2 10*3/uL (ref 0.0–0.4)
EOS: 4 %
HEMATOCRIT: 44.1 % (ref 37.5–51.0)
Hemoglobin: 15 g/dL (ref 13.0–17.7)
IMMATURE GRANS (ABS): 0 10*3/uL (ref 0.0–0.1)
Immature Granulocytes: 0 %
Lymphocytes Absolute: 1.8 10*3/uL (ref 0.7–3.1)
Lymphs: 31 %
MCH: 30.2 pg (ref 26.6–33.0)
MCHC: 34 g/dL (ref 31.5–35.7)
MCV: 89 fL (ref 79–97)
MONOS ABS: 0.3 10*3/uL (ref 0.1–0.9)
Monocytes: 6 %
Neutrophils Absolute: 3.4 10*3/uL (ref 1.4–7.0)
Neutrophils: 58 %
PLATELETS: 227 10*3/uL (ref 150–450)
RBC: 4.97 x10E6/uL (ref 4.14–5.80)
RDW: 12.8 % (ref 12.3–15.4)
WBC: 5.8 10*3/uL (ref 3.4–10.8)

## 2017-12-26 LAB — LIPID PANEL
CHOL/HDL RATIO: 3.1 ratio (ref 0.0–5.0)
Cholesterol, Total: 198 mg/dL (ref 100–199)
HDL: 64 mg/dL (ref >39)
LDL CALC: 118 mg/dL — AB (ref 0–99)
TRIGLYCERIDES: 82 mg/dL (ref 0–149)
VLDL Cholesterol Cal: 16 mg/dL (ref 5–40)

## 2017-12-26 LAB — TSH: TSH: 2.11 u[IU]/mL (ref 0.450–4.500)

## 2017-12-26 LAB — PSA: Prostate Specific Ag, Serum: 0.3 ng/mL (ref 0.0–4.0)

## 2018-07-01 ENCOUNTER — Other Ambulatory Visit: Payer: Self-pay | Admitting: Family Medicine

## 2018-07-01 NOTE — Telephone Encounter (Signed)
Please review

## 2018-09-28 ENCOUNTER — Other Ambulatory Visit: Payer: Self-pay | Admitting: Family Medicine

## 2019-01-22 NOTE — Progress Notes (Signed)
Patient: Johnny Serrano, Male    DOB: [birth year], 59 y.o.   MRN: [id number] Visit Date: 01/25/2019  Today's Provider: Wilhemena Durie, MD   Chief Complaint  Patient presents with  . Annual Exam   Subjective:     Annual physical exam Johnny Serrano is a 58 y.o. male who presents today for health maintenance and complete physical. He feels well. He reports exercising not regularly, but he does stay active. He reports he is sleeping well.  Colonoscopy- 01/03/2014. Dr. Vira Agar-- Polyps, diverticulosis and internal Hemorrhoids, otherwise normal. Repeat 10 yrs.   Immunization History  Administered Date(s) Administered  . Influenza Split 01/22/2006  . Tdap 10/20/2013     Review of Systems  Constitutional: Negative.   HENT: Negative.   Eyes: Negative.   Respiratory: Negative.   Cardiovascular: Negative.   Gastrointestinal: Negative.   Endocrine: Negative.   Genitourinary: Positive for flank pain.       On left side pt having intermittant left flank discomfort which lasts 2-3 seconds.  Skin: Negative.   Allergic/Immunologic: Negative.   Neurological: Negative.   Hematological: Negative.   Psychiatric/Behavioral: Negative.     Social History      He  reports that he has never smoked. He has never used smokeless tobacco. He reports current alcohol use. He reports that he does not use drugs.       Social History   Socioeconomic History  . Marital status: Married    Spouse name: Not on file  . Number of children: Not on file  . Years of education: Not on file  . Highest education level: Not on file  Occupational History  . Not on file  Social Needs  . Financial resource strain: Not on file  . Food insecurity    Worry: Not on file    Inability: Not on file  . Transportation needs    Medical: Not on file    Non-medical: Not on file  Tobacco Use  . Smoking status: Never Smoker  . Smokeless tobacco: Never Used  Substance and Sexual Activity  . Alcohol  use: Yes    Comment: 7-8 beers per day. Stopped and now has 2-3 beers daily during the week and 7-8 on the weekends.  . Drug use: No  . Sexual activity: Not on file  Lifestyle  . Physical activity    Days per week: Not on file    Minutes per session: Not on file  . Stress: Not on file  Relationships  . Social Herbalist on phone: Not on file    Gets together: Not on file    Attends religious service: Not on file    Active member of club or organization: Not on file    Attends meetings of clubs or organizations: Not on file    Relationship status: Not on file  Other Topics Concern  . Not on file  Social History Narrative  . Not on file    No past medical history on file.   Patient Active Problem List   Diagnosis Date Noted  . Problems influencing health status 10/08/2014  . Clinical depression 10/08/2014  . Acid reflux 10/08/2014  . 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 Avitaminosis D 10/08/2014    No past surgical history on file.  Family History        Family Status  Relation Name Status  . Mother  Alive  . Father  Deceased  . Sister  Alive  . Daughter  Alive  . Son  Alive  . Daughter  Alive        His family history includes Asthma in his daughter; Bradycardia in his son; COPD in his father; Cancer in his father; Hypotension in his father; Iron deficiency in his daughter; Osteoporosis in his father.      Allergies  Allergen Reactions  . Nsaids   . Shellfish Allergy Swelling    Can eat cooked shellfish but any raw shellfish causes swelling to area in contact     Current Outpatient Medications:  .  Cholecalciferol 1000 UNITS capsule, Take by mouth., Disp: , Rfl:  .  escitalopram (LEXAPRO) 10 MG tablet, TAKE 1 TABLET BY MOUTH EVERY DAY, Disp: 90 tablet, Rfl: 3 .  ibuprofen (ADVIL,MOTRIN) 800 MG tablet, TAKE 1 TABLET BY MOUTH EVERY 8 HOURS AS NEEDED, Disp: 270 tablet, Rfl: 3   Patient Care Team: Jerrol Banana., MD as PCP - General (Family  Medicine)    Objective:    Vitals: BP 128/70   Pulse 60   Temp 97.9 F (36.6 C)   Resp 16   Ht 6' (1.829 m)   Wt 183 lb (83 kg)   SpO2 100%   BMI 24.82 kg/m    Vitals:   01/25/19 1408  BP: 128/70  Pulse: 60  Resp: 16  Temp: 97.9 F (36.6 C)  SpO2: 100%  Weight: 183 lb (83 kg)  Height: 6' (1.829 m)     Physical Exam Vitals signs reviewed.  Constitutional:      Appearance: He is well-developed.  HENT:     Head: Normocephalic and atraumatic.     Right Ear: External ear normal.     Left Ear: External ear normal.     Nose: Nose normal.  Eyes:     General: No scleral icterus.    Conjunctiva/sclera: Conjunctivae normal.  Neck:     Thyroid: No thyromegaly.  Cardiovascular:     Rate and Rhythm: Normal rate and regular rhythm.     Heart sounds: Normal heart sounds.  Pulmonary:     Effort: Pulmonary effort is normal.     Breath sounds: Normal breath sounds.  Abdominal:     Palpations: Abdomen is soft.  Genitourinary:    Penis: Normal.      Scrotum/Testes: Normal.  Skin:    General: Skin is warm and dry.  Neurological:     General: No focal deficit present.     Mental Status: He is alert and oriented to person, place, and time.  Psychiatric:        Mood and Affect: Mood normal.        Behavior: Behavior normal.        Thought Content: Thought content normal.        Judgment: Judgment normal.      Depression Screen PHQ 2/9 Scores 01/25/2019 12/25/2017 12/19/2016  PHQ - 2 Score 0 0 1  PHQ- 9 Score 1 0 5       Assessment & Plan:     Routine Health Maintenance and Physical Exam  Exercise Activities and Dietary recommendations Goals   None     Health Maintenance  Topic Date Due  . HIV Screening  02/05/1974  . INFLUENZA VACCINE  10/31/2018  . TETANUS/TDAP  10/21/2023  . COLONOSCOPY  01/04/2024  . Hepatitis C Screening  Completed     Discussed health benefits of physical activity, and encouraged him to engage in regular exercise appropriate  for his age and condition.  1. Annual physical exam  - CBC with Differential/Platelet - Comprehensive metabolic panel - TSH - Lipid panel  2. Flank pain Likely MSK,follow for possible stone. - POCT urinalysis dipstick  3. Screening for prostate cancer  - PSA     Wilhemena Durie, MD  [facility]

## 2019-01-25 ENCOUNTER — Encounter: Payer: Self-pay | Admitting: Family Medicine

## 2019-01-25 ENCOUNTER — Other Ambulatory Visit: Payer: Self-pay

## 2019-01-25 ENCOUNTER — Ambulatory Visit (INDEPENDENT_AMBULATORY_CARE_PROVIDER_SITE_OTHER): Payer: BC Managed Care – PPO | Admitting: Family Medicine

## 2019-01-25 VITALS — BP 128/70 | HR 60 | Temp 97.9°F | Resp 16 | Ht 72.0 in | Wt 183.0 lb

## 2019-01-25 DIAGNOSIS — Z0000 Encounter for general adult medical examination without abnormal findings: Secondary | ICD-10-CM | POA: Diagnosis not present

## 2019-01-25 DIAGNOSIS — Z125 Encounter for screening for malignant neoplasm of prostate: Secondary | ICD-10-CM | POA: Diagnosis not present

## 2019-01-25 DIAGNOSIS — R109 Unspecified abdominal pain: Secondary | ICD-10-CM

## 2019-01-25 LAB — POCT URINALYSIS DIPSTICK
Bilirubin, UA: NEGATIVE
Blood, UA: NEGATIVE
Glucose, UA: NEGATIVE
Ketones, UA: NEGATIVE
Leukocytes, UA: NEGATIVE
Nitrite, UA: NEGATIVE
Protein, UA: NEGATIVE
Spec Grav, UA: 1.01 (ref 1.010–1.025)
Urobilinogen, UA: 0.2 E.U./dL
pH, UA: 6.5 (ref 5.0–8.0)

## 2019-01-27 LAB — COMPREHENSIVE METABOLIC PANEL
ALT: 27 IU/L (ref 0–44)
AST: 25 IU/L (ref 0–40)
Albumin/Globulin Ratio: 1.9 (ref 1.2–2.2)
Albumin: 4.3 g/dL (ref 3.8–4.9)
Alkaline Phosphatase: 52 IU/L (ref 39–117)
BUN/Creatinine Ratio: 11 (ref 9–20)
BUN: 12 mg/dL (ref 6–24)
Bilirubin Total: 0.6 mg/dL (ref 0.0–1.2)
CO2: 25 mmol/L (ref 20–29)
Calcium: 9.5 mg/dL (ref 8.7–10.2)
Chloride: 103 mmol/L (ref 96–106)
Creatinine, Ser: 1.08 mg/dL (ref 0.76–1.27)
GFR calc Af Amer: 86 mL/min/{1.73_m2} (ref >59)
GFR calc non Af Amer: 75 mL/min/{1.73_m2} (ref >59)
Globulin, Total: 2.3 g/dL (ref 1.5–4.5)
Glucose: 94 mg/dL (ref 65–99)
Potassium: 4.6 mmol/L (ref 3.5–5.2)
Sodium: 140 mmol/L (ref 134–144)
Total Protein: 6.6 g/dL (ref 6.0–8.5)

## 2019-01-27 LAB — LIPID PANEL
Chol/HDL Ratio: 2.6 ratio (ref 0.0–5.0)
Cholesterol, Total: 172 mg/dL (ref 100–199)
HDL: 65 mg/dL (ref >39)
LDL Chol Calc (NIH): 94 mg/dL (ref 0–99)
Triglycerides: 69 mg/dL (ref 0–149)
VLDL Cholesterol Cal: 13 mg/dL (ref 5–40)

## 2019-01-27 LAB — CBC WITH DIFFERENTIAL/PLATELET
Basophils Absolute: 0.1 10*3/uL (ref 0.0–0.2)
Basos: 1 %
EOS (ABSOLUTE): 0.2 10*3/uL (ref 0.0–0.4)
Eos: 4 %
Hematocrit: 43.1 % (ref 37.5–51.0)
Hemoglobin: 14.7 g/dL (ref 13.0–17.7)
Immature Grans (Abs): 0 10*3/uL (ref 0.0–0.1)
Immature Granulocytes: 0 %
Lymphocytes Absolute: 2.1 10*3/uL (ref 0.7–3.1)
Lymphs: 38 %
MCH: 29.6 pg (ref 26.6–33.0)
MCHC: 34.1 g/dL (ref 31.5–35.7)
MCV: 87 fL (ref 79–97)
Monocytes Absolute: 0.4 10*3/uL (ref 0.1–0.9)
Monocytes: 7 %
Neutrophils Absolute: 2.8 10*3/uL (ref 1.4–7.0)
Neutrophils: 50 %
Platelets: 215 10*3/uL (ref 150–450)
RBC: 4.96 x10E6/uL (ref 4.14–5.80)
RDW: 12.4 % (ref 11.6–15.4)
WBC: 5.5 10*3/uL (ref 3.4–10.8)

## 2019-01-27 LAB — PSA: Prostate Specific Ag, Serum: 0.3 ng/mL (ref 0.0–4.0)

## 2019-01-27 LAB — TSH: TSH: 3.08 u[IU]/mL (ref 0.450–4.500)

## 2019-05-10 ENCOUNTER — Other Ambulatory Visit: Payer: Self-pay | Admitting: Family Medicine

## 2019-05-10 DIAGNOSIS — F329 Major depressive disorder, single episode, unspecified: Secondary | ICD-10-CM

## 2019-05-10 DIAGNOSIS — F32A Depression, unspecified: Secondary | ICD-10-CM

## 2019-05-10 MED ORDER — ESCITALOPRAM OXALATE 10 MG PO TABS: 10.0000 mg | ORAL_TABLET | Freq: Every day | ORAL | 3 refills | Status: DC

## 2019-05-10 NOTE — Telephone Encounter (Signed)
[facility] faxed refill request for the following medications:  escitalopram (LEXAPRO) 10 MG tablet   Please advise.

## 2019-07-19 ENCOUNTER — Other Ambulatory Visit: Payer: Self-pay | Admitting: Family Medicine

## 2019-07-19 NOTE — Telephone Encounter (Signed)
Request for new medication not on list

## 2019-07-19 NOTE — Telephone Encounter (Signed)
Please advise refill? 

## 2019-07-19 NOTE — Telephone Encounter (Signed)
Medication: metro-gel (unable to find in chart)    Patient is requesting a refill of this medication. Patient was originally prescribed this medication by dermatologist and states that Dr. Rosanna Randy said he could manage RX.    Pharmacy:  [id number] - [place], [place] Phone:  [phone]  Fax:  [phone]

## 2019-07-20 MED ORDER — METRONIDAZOLE 1 % EX GEL: CUTANEOUS | 11 refills | Status: DC

## 2019-07-20 NOTE — Telephone Encounter (Signed)
Done

## 2019-07-20 NOTE — Telephone Encounter (Signed)
OK to refill for 1 year.

## 2019-08-20 ENCOUNTER — Other Ambulatory Visit: Payer: Self-pay | Admitting: Family Medicine

## 2020-01-25 NOTE — Progress Notes (Signed)
Zenon Mayo Miller,acting as a scribe for Wilhemena Durie, MD.,have documented all relevant documentation on the behalf of Wilhemena Durie, MD,as directed by  Wilhemena Durie, MD while in the presence of Wilhemena Durie, MD.   Complete physical exam   Patient: Johnny Serrano   DOB: 1961-02-10   58 y.o. Male  MRN: [id number] Visit Date: 01/26/2020  Today's healthcare provider: Wilhemena Durie, MD   Chief Complaint  Patient presents with  . Annual Exam   Subjective    Johnny Serrano is a 59 y.o. male who presents today for a complete physical exam.  He reports consuming a general diet. Home exercise routine includes walking. He generally feels well. He reports sleeping fairly well. He does not have additional problems to discuss today.  HPI    History reviewed. No pertinent past medical history. History reviewed. No pertinent surgical history. Social History   Socioeconomic History  . Marital status: Married    Spouse name: Not on file  . Number of children: Not on file  . Years of education: Not on file  . Highest education level: Not on file  Occupational History  . Not on file  Tobacco Use  . Smoking status: Never Smoker  . Smokeless tobacco: Never Used  Vaping Use  . Vaping Use: Never used  Substance and Sexual Activity  . Alcohol use: Yes    Comment: 7-8 beers per day. Stopped and now has 2-3 beers daily during the week and 7-8 on the weekends.  . Drug use: No  . Sexual activity: Not on file  Other Topics Concern  . Not on file  Social History Narrative  . Not on file   Social Determinants of Health   Financial Resource Strain:   . Difficulty of Paying Living Expenses: Not on file  Food Insecurity:   . Worried About Charity fundraiser in the Last Year: Not on file  . Ran Out of Food in the Last Year: Not on file  Transportation Needs:   . Lack of Transportation (Medical): Not on file  . Lack of Transportation (Non-Medical): Not on file    Physical Activity:   . Days of Exercise per Week: Not on file  . Minutes of Exercise per Session: Not on file  Stress:   . Feeling of Stress : Not on file  Social Connections:   . Frequency of Communication with Friends and Family: Not on file  . Frequency of Social Gatherings with Friends and Family: Not on file  . Attends Religious Services: Not on file  . Active Member of Clubs or Organizations: Not on file  . Attends Archivist Meetings: Not on file  . Marital Status: Not on file  Intimate Partner Violence:   . Fear of Current or Ex-Partner: Not on file  . Emotionally Abused: Not on file  . Physically Abused: Not on file  . Sexually Abused: Not on file   Family Status  Relation Name Status  . Mother  Alive  . Father  Deceased  . Sister  Alive  . Daughter  Alive  . Son  Alive  . Daughter  Alive   Family History  Problem Relation Age of Onset  . Hypotension Father   . COPD Father        former smoker  . Osteoporosis Father   . Cancer Father        stomach cancer  . Asthma Daughter   .  Iron deficiency Daughter   . Bradycardia Son        irregular heart beat   Allergies  Allergen Reactions  . Nsaids   . Shellfish Allergy Swelling    Can eat cooked shellfish but any raw shellfish causes swelling to area in contact    Patient Care Team: Jerrol Banana., MD as PCP - General (Family Medicine)   Medications: Outpatient Medications Prior to Visit  Medication Sig  . Cholecalciferol 1000 UNITS capsule Take by mouth.  . escitalopram (LEXAPRO) 10 MG tablet Take 1 tablet (10 mg total) by mouth daily.  Marland Kitchen ibuprofen (ADVIL) 800 MG tablet TAKE 1 TABLET BY MOUTH EVERY 8 HOURS AS NEEDED  . metroNIDAZOLE (METROGEL) 1 % gel APPLY A SMALL AMOUNT TO AFFECTED AREA ONCE A DAY AS DIRECTED   No facility-administered medications prior to visit.    Review of Systems  All other systems reviewed and are negative.      Objective    BP 128/84 (BP Location: Right  Arm, Patient Position: Sitting, Cuff Size: Large)   Pulse 60   Temp 98.1 F (36.7 C) (Oral)   Resp 16   Ht 6' (1.829 m)   Wt 179 lb (81.2 kg)   SpO2 100%   BMI 24.28 kg/m  Wt Readings from Last 3 Encounters:  01/26/20 179 lb (81.2 kg)  01/25/19 183 lb (83 kg)  12/25/17 172 lb (78 kg)      Physical Exam Vitals reviewed.  Constitutional:      Appearance: Normal appearance. He is well-developed.  HENT:     Head: Normocephalic and atraumatic.     Right Ear: External ear normal.     Left Ear: External ear normal.     Nose: Nose normal.  Eyes:     General: No scleral icterus.    Conjunctiva/sclera: Conjunctivae normal.  Neck:     Thyroid: No thyromegaly.  Cardiovascular:     Rate and Rhythm: Normal rate and regular rhythm.     Heart sounds: Normal heart sounds.  Pulmonary:     Effort: Pulmonary effort is normal.     Breath sounds: Normal breath sounds.  Abdominal:     Palpations: Abdomen is soft.  Genitourinary:    Penis: Normal.      Testes: Normal.     Prostate: Normal.     Rectum: Normal.  Skin:    General: Skin is warm and dry.  Neurological:     General: No focal deficit present.     Mental Status: He is alert and oriented to person, place, and time.  Psychiatric:        Mood and Affect: Mood normal.        Behavior: Behavior normal.        Thought Content: Thought content normal.        Judgment: Judgment normal.       Last depression screening scores PHQ 2/9 Scores 01/26/2020 01/25/2019 12/25/2017  PHQ - 2 Score 1 0 0  PHQ- 9 Score 3 1 0   Last fall risk screening Fall Risk  01/25/2019  Falls in the past year? 0  Number falls in past yr: 0  Injury with Fall? 0  Follow up Falls evaluation completed   Last Audit-C alcohol use screening Alcohol Use Disorder Test (AUDIT) 01/26/2020  1. How often do you have a drink containing alcohol? 4  2. How many drinks containing alcohol do you have on a typical day when you are drinking?  1  3. How often do  you have six or more drinks on one occasion? 3  AUDIT-C Score 8  4. How often during the last year have you found that you were not able to stop drinking once you had started? -  5. How often during the last year have you failed to do what was normally expected from you because of drinking? -  6. How often during the last year have you needed a first drink in the morning to get yourself going after a heavy drinking session? -  7. How often during the last year have you had a feeling of guilt of remorse after drinking? -  8. How often during the last year have you been unable to remember what happened the night before because you had been drinking? -  9. Have you or someone else been injured as a result of your drinking? -  10. Has a relative or friend or a doctor or another health worker been concerned about your drinking or suggested you cut down? -  Alcohol Use Disorder Identification Test Final Score (AUDIT) -  Alcohol Brief Interventions/Follow-up -   A score of 3 or more in women, and 4 or more in men indicates increased risk for alcohol abuse, EXCEPT if all of the points are from question 1   Results for orders placed or performed in visit on 01/26/20  POCT urinalysis dipstick  Result Value Ref Range   Color, UA Yellow    Clarity, UA Clear    Glucose, UA Negative Negative   Bilirubin, UA Negative    Ketones, UA Negative    Spec Grav, UA 1.020 1.010 - 1.025   Blood, UA Negative    pH, UA 5.0 5.0 - 8.0   Protein, UA Negative Negative   Urobilinogen, UA 0.2 0.2 or 1.0 E.U./dL   Nitrite, UA Negative    Leukocytes, UA Negative Negative   Appearance Normal    Odor Normal   IFOBT POC (occult bld, rslt in office)  Result Value Ref Range   IFOBT Negative     Assessment & Plan    Routine Health Maintenance and Physical Exam  Exercise Activities and Dietary recommendations Goals   None     Immunization History  Administered Date(s) Administered  . Influenza Split 01/22/2006    . Tdap 10/20/2013  . Zoster Recombinat (Shingrix) 01/26/2020    Health Maintenance  Topic Date Due  . COVID-19 Vaccine (1) Never done  . HIV Screening  Never done  . INFLUENZA VACCINE  10/30/2020 (Originally 10/31/2019)  . TETANUS/TDAP  10/21/2023  . COLONOSCOPY  01/04/2024  . Hepatitis C Screening  Completed    Discussed health benefits of physical activity, and encouraged him to engage in regular exercise appropriate for his age and condition.  1. Annual physical exam Overall good health. - Lipid panel - CBC w/Diff/Platelet - Comprehensive Metabolic Panel (CMET) - TSH - PSA  2. Screening for prostate cancer   3. Need for shingles vaccine  - Varicella-zoster vaccine IM (Shingrix)  4. Screening for blood or protein in urine  - POCT urinalysis dipstick--Normal  5. Encounter for screening fecal occult blood testing  - IFOBT POC (occult bld, rslt in office)--Negative   Return in about 1 year (around 01/25/2021).        Annamae Shivley Cranford Mon, MD  [facility] [phone] (phone) [phone] (fax)  [place]

## 2020-01-26 ENCOUNTER — Ambulatory Visit (INDEPENDENT_AMBULATORY_CARE_PROVIDER_SITE_OTHER): Payer: BC Managed Care – PPO | Admitting: Family Medicine

## 2020-01-26 ENCOUNTER — Other Ambulatory Visit: Payer: Self-pay

## 2020-01-26 ENCOUNTER — Encounter: Payer: Self-pay | Admitting: Family Medicine

## 2020-01-26 VITALS — BP 128/84 | HR 60 | Temp 98.1°F | Resp 16 | Ht 72.0 in | Wt 179.0 lb

## 2020-01-26 DIAGNOSIS — Z1211 Encounter for screening for malignant neoplasm of colon: Secondary | ICD-10-CM | POA: Diagnosis not present

## 2020-01-26 DIAGNOSIS — Z125 Encounter for screening for malignant neoplasm of prostate: Secondary | ICD-10-CM | POA: Diagnosis not present

## 2020-01-26 DIAGNOSIS — Z0000 Encounter for general adult medical examination without abnormal findings: Secondary | ICD-10-CM

## 2020-01-26 DIAGNOSIS — Z23 Encounter for immunization: Secondary | ICD-10-CM | POA: Diagnosis not present

## 2020-01-26 DIAGNOSIS — Z1389 Encounter for screening for other disorder: Secondary | ICD-10-CM | POA: Diagnosis not present

## 2020-01-26 LAB — POCT URINALYSIS DIPSTICK
Appearance: NORMAL
Bilirubin, UA: NEGATIVE
Blood, UA: NEGATIVE
Glucose, UA: NEGATIVE
Ketones, UA: NEGATIVE
Leukocytes, UA: NEGATIVE
Nitrite, UA: NEGATIVE
Odor: NORMAL
Protein, UA: NEGATIVE
Spec Grav, UA: 1.02 (ref 1.010–1.025)
Urobilinogen, UA: 0.2 E.U./dL
pH, UA: 5 (ref 5.0–8.0)

## 2020-01-26 LAB — IFOBT (OCCULT BLOOD): IFOBT: NEGATIVE

## 2020-01-27 LAB — CBC WITH DIFFERENTIAL/PLATELET
Basophils Absolute: 0 10*3/uL (ref 0.0–0.2)
Basos: 1 %
EOS (ABSOLUTE): 0.1 10*3/uL (ref 0.0–0.4)
Eos: 2 %
Hematocrit: 43.3 % (ref 37.5–51.0)
Hemoglobin: 14.5 g/dL (ref 13.0–17.7)
Immature Grans (Abs): 0 10*3/uL (ref 0.0–0.1)
Immature Granulocytes: 0 %
Lymphocytes Absolute: 2 10*3/uL (ref 0.7–3.1)
Lymphs: 37 %
MCH: 30.1 pg (ref 26.6–33.0)
MCHC: 33.5 g/dL (ref 31.5–35.7)
MCV: 90 fL (ref 79–97)
Monocytes Absolute: 0.3 10*3/uL (ref 0.1–0.9)
Monocytes: 5 %
Neutrophils Absolute: 3 10*3/uL (ref 1.4–7.0)
Neutrophils: 55 %
Platelets: 205 10*3/uL (ref 150–450)
RBC: 4.81 x10E6/uL (ref 4.14–5.80)
RDW: 12.3 % (ref 11.6–15.4)
WBC: 5.5 10*3/uL (ref 3.4–10.8)

## 2020-01-27 LAB — LIPID PANEL
Chol/HDL Ratio: 2.8 ratio (ref 0.0–5.0)
Cholesterol, Total: 163 mg/dL (ref 100–199)
HDL: 58 mg/dL (ref >39)
LDL Chol Calc (NIH): 94 mg/dL (ref 0–99)
Triglycerides: 56 mg/dL (ref 0–149)
VLDL Cholesterol Cal: 11 mg/dL (ref 5–40)

## 2020-01-27 LAB — COMPREHENSIVE METABOLIC PANEL
ALT: 25 IU/L (ref 0–44)
AST: 24 IU/L (ref 0–40)
Albumin/Globulin Ratio: 1.8 (ref 1.2–2.2)
Albumin: 4.4 g/dL (ref 3.8–4.9)
Alkaline Phosphatase: 54 IU/L (ref 44–121)
BUN/Creatinine Ratio: 13 (ref 10–24)
BUN: 13 mg/dL (ref 8–27)
Bilirubin Total: 0.6 mg/dL (ref 0.0–1.2)
CO2: 24 mmol/L (ref 20–29)
Calcium: 9.3 mg/dL (ref 8.6–10.2)
Chloride: 102 mmol/L (ref 96–106)
Creatinine, Ser: 1.01 mg/dL (ref 0.76–1.27)
GFR calc Af Amer: 93 mL/min/{1.73_m2} (ref >59)
GFR calc non Af Amer: 80 mL/min/{1.73_m2} (ref >59)
Globulin, Total: 2.5 g/dL (ref 1.5–4.5)
Glucose: 90 mg/dL (ref 65–99)
Potassium: 4.4 mmol/L (ref 3.5–5.2)
Sodium: 139 mmol/L (ref 134–144)
Total Protein: 6.9 g/dL (ref 6.0–8.5)

## 2020-01-27 LAB — TSH: TSH: 2.11 u[IU]/mL (ref 0.450–4.500)

## 2020-01-27 LAB — PSA: Prostate Specific Ag, Serum: 0.4 ng/mL (ref 0.0–4.0)

## 2020-02-27 ENCOUNTER — Other Ambulatory Visit: Payer: Self-pay | Admitting: Family Medicine

## 2020-03-28 ENCOUNTER — Telehealth: Payer: Self-pay

## 2020-03-28 NOTE — Telephone Encounter (Signed)
Advised patient's wife Elnita Maxwell that [facility] is [facility]

## 2020-03-28 NOTE — Telephone Encounter (Signed)
Copied from CRM [id number]. Topic: General - Other >> Mar 28, 2020 11:55 AM Jaquita Rector A wrote: Reason for CRM: Patient wife Elnita Maxwell  called in to say that he is to get his second Shingles vaccine and she want to know if he can [facility] since he will not be in town for the appointment scheduled. Also want to know if its ok to get shingles and Covid booster at the same time. Elnita Maxwell Ph# [phone]

## 2020-04-03 ENCOUNTER — Ambulatory Visit: Payer: Self-pay | Admitting: Family Medicine

## 2020-05-05 ENCOUNTER — Other Ambulatory Visit: Payer: Self-pay | Admitting: Family Medicine

## 2020-05-05 DIAGNOSIS — F32A Depression, unspecified: Secondary | ICD-10-CM

## 2020-08-03 ENCOUNTER — Other Ambulatory Visit: Payer: Self-pay | Admitting: Family Medicine

## 2020-08-03 DIAGNOSIS — F32A Depression, unspecified: Secondary | ICD-10-CM

## 2020-08-03 NOTE — Telephone Encounter (Signed)
Requested Prescriptions  Pending Prescriptions Disp Refills  . escitalopram (LEXAPRO) 10 MG tablet [Pharmacy Med Name: ESCITALOPRAM TABS 10MG ] 90 tablet 0    Sig: TAKE 1 TABLET DAILY     Psychiatry:  Antidepressants - SSRI Failed - 08/03/2020 12:53 AM      Failed - Valid encounter within last 6 months    Recent Outpatient Visits          6 months ago Annual physical exam   [facility] Jerrol Banana., MD   1 year ago Annual physical exam   [facility] Jerrol Banana., MD   2 years ago Annual physical exam   [facility] Jerrol Banana., MD   3 years ago Annual physical exam   [facility] Jerrol Banana., MD   4 years ago Annual physical exam   [facility] Jerrol Banana., MD      Future Appointments            In 5 months Jerrol Banana., MD [facility]           Passed - Completed PHQ-2 or PHQ-9 in the last 360 days

## 2020-11-01 ENCOUNTER — Other Ambulatory Visit: Payer: Self-pay | Admitting: Family Medicine

## 2020-11-01 DIAGNOSIS — F32A Depression, unspecified: Secondary | ICD-10-CM

## 2020-11-01 NOTE — Telephone Encounter (Signed)
Requested medications are due for refill today.  yes  Requested medications are on the active medications list.  yes  Last refill. 08/03/2020  Future visit scheduled.   yes  Notes to clinic.  Already given a courtesy refill.

## 2021-01-19 ENCOUNTER — Telehealth: Payer: Self-pay

## 2021-01-19 NOTE — Telephone Encounter (Signed)
Copied from [place] [id number]. Topic: General - Other >> Jan 19, 2021  1:10 PM Leward Quan A wrote: Reason for CRM: Patient wife called in to say that on 01/15/21 they [facility]

## 2021-01-19 NOTE — Telephone Encounter (Signed)
Noted  

## 2021-01-29 ENCOUNTER — Encounter: Payer: Self-pay | Admitting: Dermatology

## 2021-01-29 ENCOUNTER — Ambulatory Visit (INDEPENDENT_AMBULATORY_CARE_PROVIDER_SITE_OTHER): Payer: BC Managed Care – PPO | Admitting: Family Medicine

## 2021-01-29 ENCOUNTER — Ambulatory Visit: Payer: BC Managed Care – PPO | Admitting: Dermatology

## 2021-01-29 ENCOUNTER — Other Ambulatory Visit: Payer: Self-pay

## 2021-01-29 VITALS — BP 116/69 | HR 67 | Temp 98.6°F | Ht 72.0 in | Wt 179.0 lb

## 2021-01-29 DIAGNOSIS — M7742 Metatarsalgia, left foot: Secondary | ICD-10-CM | POA: Diagnosis not present

## 2021-01-29 DIAGNOSIS — D229 Melanocytic nevi, unspecified: Secondary | ICD-10-CM | POA: Diagnosis not present

## 2021-01-29 DIAGNOSIS — Z1283 Encounter for screening for malignant neoplasm of skin: Secondary | ICD-10-CM | POA: Diagnosis not present

## 2021-01-29 DIAGNOSIS — D1800 Hemangioma unspecified site: Secondary | ICD-10-CM

## 2021-01-29 DIAGNOSIS — Z125 Encounter for screening for malignant neoplasm of prostate: Secondary | ICD-10-CM

## 2021-01-29 DIAGNOSIS — L578 Other skin changes due to chronic exposure to nonionizing radiation: Secondary | ICD-10-CM | POA: Diagnosis not present

## 2021-01-29 DIAGNOSIS — Z23 Encounter for immunization: Secondary | ICD-10-CM | POA: Diagnosis not present

## 2021-01-29 DIAGNOSIS — L570 Actinic keratosis: Secondary | ICD-10-CM

## 2021-01-29 DIAGNOSIS — L814 Other melanin hyperpigmentation: Secondary | ICD-10-CM

## 2021-01-29 DIAGNOSIS — L820 Inflamed seborrheic keratosis: Secondary | ICD-10-CM

## 2021-01-29 DIAGNOSIS — Z0000 Encounter for general adult medical examination without abnormal findings: Secondary | ICD-10-CM | POA: Diagnosis not present

## 2021-01-29 DIAGNOSIS — L719 Rosacea, unspecified: Secondary | ICD-10-CM | POA: Diagnosis not present

## 2021-01-29 DIAGNOSIS — F3342 Major depressive disorder, recurrent, in full remission: Secondary | ICD-10-CM

## 2021-01-29 DIAGNOSIS — E559 Vitamin D deficiency, unspecified: Secondary | ICD-10-CM

## 2021-01-29 DIAGNOSIS — L821 Other seborrheic keratosis: Secondary | ICD-10-CM

## 2021-01-29 NOTE — Progress Notes (Signed)
Complete physical exam   Patient: Johnny Serrano   DOB: [birth year]   60 y.o. Male  MRN: [id number] Visit Date: 01/29/2021  Today's healthcare provider: Wilhemena Durie, MD   No chief complaint on file.  Subjective    Johnny Serrano is a 60 y.o. male who presents today for a complete physical exam.  He reports consuming a general diet.  He generally feels well. He reports sleeping well. He does not have additional problems to discuss today. He is exercising regularly. He is married and is a father of 3.  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  One of his daughters is getting ready to give birth to their first grandchild.  He exercises almost daily. HPI  He does complain of some ongoing foot pain on the metatarsal pad of the left foot with weightbearing .he does not go barefoot.  No past medical history on file. No past surgical history on file. Social History   Socioeconomic History   Marital status: Married    Spouse name: Not on file   Number of children: Not on file   Years of education: Not on file   Highest education level: Not on file  Occupational History   Not on file  Tobacco Use   Smoking status: Never   Smokeless tobacco: Never  Vaping Use   Vaping Use: Never used  Substance and Sexual Activity   Alcohol use: Yes    Comment: 7-8 beers per day. Stopped and now has 2-3 beers daily during the week and 7-8 on the weekends.   Drug use: No   Sexual activity: Not on file  Other Topics Concern   Not on file  Social History Narrative   Not on file   Social Determinants of Health   Financial Resource Strain: Not on file  Food Insecurity: Not on file  Transportation Needs: Not on file  Physical Activity: Not on file  Stress: Not on file  Social Connections: Not on file  Intimate Partner Violence: Not on file   Family Status  Relation Name Status   Mother  Alive   Father  Deceased   Sister  Alive   Daughter  Alive   Son  Alive   Daughter  Alive    Family History  Problem Relation Age of Onset   Hypotension Father    COPD Father        former smoker   Osteoporosis Father    Cancer Father        stomach cancer   Asthma Daughter    Iron deficiency Daughter    Bradycardia Son        irregular heart beat   Allergies  Allergen Reactions   Nsaids    Shellfish Allergy Swelling    Can eat cooked shellfish but any raw shellfish causes swelling to area in contact    Patient Care Team: Jerrol Banana., MD as PCP - General (Family Medicine)   Medications: Outpatient Medications Prior to Visit  Medication Sig   Cholecalciferol 1000 UNITS capsule Take by mouth.   escitalopram (LEXAPRO) 10 MG tablet Take 1 tablet (10 mg total) by mouth daily.   ibuprofen (ADVIL) 800 MG tablet TAKE 1 TABLET BY MOUTH EVERY 8 HOURS AS NEEDED   metroNIDAZOLE (METROGEL) 1 % gel APPLY A SMALL AMOUNT TO AFFECTED AREA ONCE A DAY AS DIRECTED   No facility-administered medications prior to visit.    Review of Systems  Constitutional: Negative.   HENT: Negative.    Eyes: Negative.   Respiratory: Negative.    Cardiovascular: Negative.   Gastrointestinal: Negative.   Endocrine: Negative.   Genitourinary: Negative.   Musculoskeletal: Negative.   Skin: Negative.   Allergic/Immunologic: Negative.   Neurological: Negative.   Hematological: Negative.   Psychiatric/Behavioral: Negative.        Objective    BP 116/69 (BP Location: Left Arm, Patient Position: Sitting, Cuff Size: Large)   Pulse 67   Temp 98.6 F (37 C) (Oral)   Ht 6' (1.829 m)   Wt 179 lb (81.2 kg)   SpO2 98%   BMI 24.28 kg/m     Physical Exam Constitutional:      Appearance: Normal appearance. He is normal weight.  HENT:     Head: Normocephalic and atraumatic.     Right Ear: Tympanic membrane, ear canal and external ear normal.     Left Ear: Tympanic membrane, ear canal and external ear normal.     Nose: Nose normal.     Mouth/Throat:     Mouth: Mucous membranes  are moist.     Pharynx: Oropharynx is clear.  Eyes:     Extraocular Movements: Extraocular movements intact.     Conjunctiva/sclera: Conjunctivae normal.     Pupils: Pupils are equal, round, and reactive to light.  Cardiovascular:     Rate and Rhythm: Normal rate and regular rhythm.     Pulses: Normal pulses.     Heart sounds: Normal heart sounds.  Pulmonary:     Effort: Pulmonary effort is normal.     Breath sounds: Normal breath sounds.  Abdominal:     General: Abdomen is flat. Bowel sounds are normal.     Palpations: Abdomen is soft.  Musculoskeletal:        General: Normal range of motion.     Cervical back: Normal range of motion and neck supple.  Skin:    General: Skin is warm and dry.  Neurological:     General: No focal deficit present.     Mental Status: He is alert and oriented to person, place, and time. Mental status is at baseline.  Psychiatric:        Mood and Affect: Mood normal.        Behavior: Behavior normal.        Thought Content: Thought content normal.        Judgment: Judgment normal.      Last depression screening scores PHQ 2/9 Scores 01/29/2021 01/26/2020 01/25/2019  PHQ - 2 Score 0 1 0  PHQ- 9 Score 0 3 1   Last fall risk screening Fall Risk  01/29/2021  Falls in the past year? 0  Number falls in past yr: 0  Injury with Fall? 0  Follow up -   Last Audit-C alcohol use screening Alcohol Use Disorder Test (AUDIT) 01/29/2021  1. How often do you have a drink containing alcohol? 4  2. How many drinks containing alcohol do you have on a typical day when you are drinking? 2  3. How often do you have six or more drinks on one occasion? 2  AUDIT-C Score 8  4. How often during the last year have you found that you were not able to stop drinking once you had started? -  5. How often during the last year have you failed to do what was normally expected from you because of drinking? -  6. How often during the last year  have you needed a first drink  in the morning to get yourself going after a heavy drinking session? -  7. How often during the last year have you had a feeling of guilt of remorse after drinking? -  8. How often during the last year have you been unable to remember what happened the night before because you had been drinking? -  9. Have you or someone else been injured as a result of your drinking? -  10. Has a relative or friend or a doctor or another health worker been concerned about your drinking or suggested you cut down? -  Alcohol Use Disorder Identification Test Final Score (AUDIT) -  Alcohol Brief Interventions/Follow-up -   A score of 3 or more in women, and 4 or more in men indicates increased risk for alcohol abuse, EXCEPT if all of the points are from question 1   No results found for any visits on 01/29/21.  Assessment & Plan    Routine Health Maintenance and Physical Exam  Exercise Activities and Dietary recommendations  Goals   None     Immunization History  Administered Date(s) Administered   Influenza Split 01/22/2006   Tdap 10/20/2013, 01/15/2021   Zoster Recombinat (Shingrix) 01/26/2020    Health Maintenance  Topic Date Due   COVID-19 Vaccine (1) Never done   HIV Screening  Never done   Zoster Vaccines- Shingrix (2 of 2) 03/22/2020   INFLUENZA VACCINE  10/30/2020   COLONOSCOPY (Pts 45-41yrs Insurance coverage will need to be confirmed)  01/04/2024   TETANUS/TDAP  01/16/2031   Hepatitis C Screening  Completed   Pneumococcal Vaccine 33-75 Years old  Aged Out   HPV VACCINES  Aged Out    Discussed health benefits of physical activity, and encouraged him to engage in regular exercise appropriate for his age and condition.  1. Annual physical exam Follow-up 1 year.  Colonoscopy in 2025 - CBC with Differential/Platelet - Comprehensive metabolic panel - Lipid Panel With LDL/HDL Ratio - TSH  2. Need for influenza vaccination   3. Prostate cancer screening  - PSA  4. Metatarsalgia  of left foot Ibuprofen for [redacted] weeks along with good supportive shoes.  May need referral to podiatry  5. Recurrent major depressive disorder, in full remission ([place]) In clinical remission.  Continue Lexapro indefinitely   No follow-ups on file.     I, Wilhemena Durie, MD, have reviewed all documentation for this visit. The documentation on 01/29/21 for the exam, diagnosis, procedures, and orders are all accurate and complete.    Tyric Rodeheaver Cranford Mon, MD  [facility] [phone] (phone) [phone] (fax)  [place]

## 2021-01-29 NOTE — Patient Instructions (Signed)
Instructions for [facility] Medications  One or more of your medications was sent to the [facility] mail order compounding pharmacy. You will receive an email from them and can purchase the medicine through that link. It will then be mailed to your home at the address you confirmed. If for any reason you do not receive an email from them, please check your spam folder. If you still do not find the email, please let us know. [facility] phone number is [phone].   Cryotherapy Aftercare  Wash gently with soap and water everyday.   Apply Vaseline and Band-Aid daily until healed.   If you have any questions or concerns for your doctor, please call our main line at [phone] and press option 4 to reach your doctor's medical assistant. If no one answers, please leave a voicemail as directed and we will return your call as soon as possible. Messages left after 4 pm will be answered the following business day.   You may also send [place] a message via [place]. We typically respond to MyChart messages within 1-2 business days.  For prescription refills, please ask your pharmacy to contact our office. Our fax number is [phone].  If you have an urgent issue when the clinic is closed that cannot wait until the next business day, you can page your doctor at the number below.    Please note that while we do our best to be available for urgent issues outside of office hours, we are not available 24/7.   If you have an urgent issue and are unable to reach [place], you may choose to seek medical care at your doctor's office, retail clinic, urgent care center, or emergency room.  If you have a medical emergency, please immediately call 911 or go to the emergency department.  Pager Numbers  - Dr. Nehemiah Massed: [phone]  - Dr. Laurence Ferrari: [phone]  - Dr. Nicole Kindred: [phone]  In the event of inclement weather, please call our main line at [phone] for an update on the status of any  delays or closures.  Dermatology Medication Tips: Please keep the boxes that topical medications come in in order to help keep track of the instructions about where and how to use these. Pharmacies typically print the medication instructions only on the boxes and not directly on the medication tubes.   If your medication is too expensive, please contact our office at [phone] option 4 or send [place] a message through [place].   We are unable to tell what your co-pay for medications will be in advance as this is different depending on your insurance coverage. However, we may be able to find a substitute medication at lower cost or fill out paperwork to get insurance to cover a needed medication.   If a prior authorization is required to get your medication covered by your insurance company, please allow [place] 1-2 business days to complete this process.  Drug prices often vary depending on where the prescription is filled and some pharmacies may offer cheaper prices.  The website www.goodrx.com contains coupons for medications through different pharmacies. The prices here do not account for what the cost may be with help from insurance (it may be cheaper with your insurance), but the website can give you the price if you did not use any insurance.  - You can print the associated coupon and take it with your prescription to the pharmacy.  - You may also stop by our office during regular business hours and pick  up a GoodRx coupon card.  - If you need your prescription sent electronically to a different pharmacy, notify our office through [facility] or by phone at [phone] option 4.

## 2021-01-29 NOTE — Progress Notes (Signed)
Follow-Up Visit   Subjective  Johnny Serrano is a 60 y.o. male who presents for the following: Other (Scaly spots of scalp and left post shoulder). The patient presents for Upper Body Skin Exam (UBSE) for skin cancer screening and mole check.  The following portions of the chart were reviewed this encounter and updated as appropriate:   Tobacco  Allergies  Meds  Problems  Med Hx  Surg Hx  Fam Hx     Review of Systems:  No other skin or systemic complaints except as noted in HPI or Assessment and Plan.  Objective  Well appearing patient in no apparent distress; mood and affect are within normal limits.  All skin waist up examined.  Left shoulder x 1, scalp x 1 (2) Erythematous keratotic or waxy stuck-on papule or plaque.   Scalp (3) Erythematous thin papules/macules with gritty scale.    Assessment & Plan   Lentigines - Scattered tan macules - Due to sun exposure - Benign-appearing, observe - Recommend daily broad spectrum sunscreen SPF 30+ to sun-exposed areas, reapply every 2 hours as needed. - Call for any changes  Seborrheic Keratoses - Stuck-on, waxy, tan-brown papules and/or plaques  - Benign-appearing - Discussed benign etiology and prognosis. - Observe - Call for any changes  Melanocytic Nevi - Tan-brown and/or pink-flesh-colored symmetric macules and papules - Benign appearing on exam today - Observation - Call clinic for new or changing moles - Recommend daily use of broad spectrum spf 30+ sunscreen to sun-exposed areas.   Hemangiomas - Red papules - Discussed benign nature - Observe - Call for any changes  Actinic Damage - Chronic condition, secondary to cumulative UV/sun exposure - diffuse scaly erythematous macules with underlying dyspigmentation - Recommend daily broad spectrum sunscreen SPF 30+ to sun-exposed areas, reapply every 2 hours as needed.  - Staying in the shade or wearing long sleeves, sun glasses (UVA+UVB protection) and  wide brim hats (4-inch brim around the entire circumference of the hat) are also recommended for sun protection.  - Call for new or changing lesions.  Skin cancer screening performed today.  Inflamed seborrheic keratosis Left shoulder x 1, scalp x 1  Destruction of lesion - Left shoulder x 1, scalp x 1 Complexity: simple   Destruction method: cryotherapy   Informed consent: discussed and consent obtained   Timeout:  patient name, date of birth, surgical site, and procedure verified Lesion destroyed using liquid nitrogen: Yes   Region frozen until ice ball extended beyond lesion: Yes   Outcome: patient tolerated procedure well with no complications   Post-procedure details: wound care instructions given    Rosacea Face  Rosacea is a chronic progressive skin condition usually affecting the face of adults, causing redness and/or acne bumps. It is treatable but not curable. It sometimes affects the eyes (ocular rosacea) as well. It may respond to topical and/or systemic medication and can flare with stress, sun exposure, alcohol, exercise and some foods.  Daily application of broad spectrum spf 30+ sunscreen to face is recommended to reduce flares.  Will prescribe [facility] metronidazole/ivermectin/azelaic acid twice daily as needed to affected areas on the face. The patient was advised this is not covered by insurance since it is made by a compounding pharmacy. They will receive an email to check out and the medication will be mailed to their home.    AK (actinic keratosis) (3) Scalp  Destruction of lesion - Scalp Complexity: simple   Destruction method: cryotherapy   Informed consent:  discussed and consent obtained   Timeout:  patient name, date of birth, surgical site, and procedure verified Lesion destroyed using liquid nitrogen: Yes   Region frozen until ice ball extended beyond lesion: Yes   Outcome: patient tolerated procedure well with no complications   Post-procedure  details: wound care instructions given    Return in about 1 year (around 01/29/2022).  I, Ashok Cordia, CMA, am acting as scribe for Sarina Ser, MD . Documentation: I have reviewed the above documentation for accuracy and completeness, and I agree with the above.  Sarina Ser, MD

## 2021-01-30 LAB — CBC WITH DIFFERENTIAL/PLATELET
Basophils Absolute: 0.1 10*3/uL (ref 0.0–0.2)
Basos: 1 %
EOS (ABSOLUTE): 0.2 10*3/uL (ref 0.0–0.4)
Eos: 3 %
Hematocrit: 45.6 % (ref 37.5–51.0)
Hemoglobin: 15.7 g/dL (ref 13.0–17.7)
Immature Grans (Abs): 0 10*3/uL (ref 0.0–0.1)
Immature Granulocytes: 0 %
Lymphocytes Absolute: 1.9 10*3/uL (ref 0.7–3.1)
Lymphs: 33 %
MCH: 29.8 pg (ref 26.6–33.0)
MCHC: 34.4 g/dL (ref 31.5–35.7)
MCV: 87 fL (ref 79–97)
Monocytes Absolute: 0.4 10*3/uL (ref 0.1–0.9)
Monocytes: 6 %
Neutrophils Absolute: 3.3 10*3/uL (ref 1.4–7.0)
Neutrophils: 57 %
Platelets: 177 10*3/uL (ref 150–450)
RBC: 5.27 x10E6/uL (ref 4.14–5.80)
RDW: 12.3 % (ref 11.6–15.4)
WBC: 5.8 10*3/uL (ref 3.4–10.8)

## 2021-01-30 LAB — COMPREHENSIVE METABOLIC PANEL
ALT: 29 IU/L (ref 0–44)
AST: 27 IU/L (ref 0–40)
Albumin/Globulin Ratio: 1.9 (ref 1.2–2.2)
Albumin: 4.8 g/dL (ref 3.8–4.8)
Alkaline Phosphatase: 58 IU/L (ref 44–121)
BUN/Creatinine Ratio: 16 (ref 10–24)
BUN: 16 mg/dL (ref 8–27)
Bilirubin Total: 0.5 mg/dL (ref 0.0–1.2)
CO2: 19 mmol/L — ABNORMAL LOW (ref 20–29)
Calcium: 9.4 mg/dL (ref 8.6–10.2)
Chloride: 103 mmol/L (ref 96–106)
Creatinine, Ser: 1 mg/dL (ref 0.76–1.27)
Globulin, Total: 2.5 g/dL (ref 1.5–4.5)
Glucose: 95 mg/dL (ref 70–99)
Potassium: 4.9 mmol/L (ref 3.5–5.2)
Sodium: 143 mmol/L (ref 134–144)
Total Protein: 7.3 g/dL (ref 6.0–8.5)
eGFR: 86 mL/min/{1.73_m2} (ref >59)

## 2021-01-30 LAB — LIPID PANEL WITH LDL/HDL RATIO
Cholesterol, Total: 196 mg/dL (ref 100–199)
HDL: 65 mg/dL (ref >39)
LDL Chol Calc (NIH): 120 mg/dL — ABNORMAL HIGH (ref 0–99)
LDL/HDL Ratio: 1.8 ratio (ref 0.0–3.6)
Triglycerides: 61 mg/dL (ref 0–149)
VLDL Cholesterol Cal: 11 mg/dL (ref 5–40)

## 2021-01-30 LAB — TSH: TSH: 2.59 u[IU]/mL (ref 0.450–4.500)

## 2021-01-30 LAB — PSA: Prostate Specific Ag, Serum: 0.4 ng/mL (ref 0.0–4.0)

## 2021-04-30 ENCOUNTER — Other Ambulatory Visit: Payer: Self-pay | Admitting: Family Medicine

## 2021-04-30 DIAGNOSIS — F32A Depression, unspecified: Secondary | ICD-10-CM

## 2021-11-12 ENCOUNTER — Encounter: Payer: Self-pay | Admitting: Dermatology

## 2021-11-12 ENCOUNTER — Ambulatory Visit: Payer: BC Managed Care – PPO | Admitting: Dermatology

## 2021-11-12 DIAGNOSIS — L570 Actinic keratosis: Secondary | ICD-10-CM

## 2021-11-12 DIAGNOSIS — L578 Other skin changes due to chronic exposure to nonionizing radiation: Secondary | ICD-10-CM

## 2021-11-12 MED ORDER — FLUOROURACIL 5 % EX CREA: TOPICAL_CREAM | Freq: Two times a day (BID) | CUTANEOUS | 2 refills | Status: DC

## 2021-11-12 NOTE — Patient Instructions (Addendum)
Cryotherapy Aftercare  Wash gently with soap and water everyday.   Apply Vaseline daily until healed.    Start 5-fluorouracil/calcipotriene cream twice a day for 7 days to affected areas including forehead, frontal scalp. Prescription sent to [facility]. Patient provided with contact information for pharmacy and advised the pharmacy will mail the prescription to their home. Patient provided with handout reviewing treatment course and side effects and advised to call or message [place] on MyChart with any concerns. Start in mid September.   5-Fluorouracil/Calcipotriene Patient Education   Actinic keratoses are the dry, red scaly spots on the skin caused by sun damage. A portion of these spots can turn into skin cancer with time, and treating them can help prevent development of skin cancer.   Treatment of these spots requires removal of the defective skin cells. There are various ways to remove actinic keratoses, including freezing with liquid nitrogen, treatment with creams, or treatment with a blue light procedure in the office.   5-fluorouracil cream is a topical cream used to treat actinic keratoses. It works by interfering with the growth of abnormal fast-growing skin cells, such as actinic keratoses. These cells peel off and are replaced by healthy ones.   5-fluorouracil/calcipotriene is a combination of the 5-fluorouracil cream with a vitamin D analog cream called calcipotriene. The calcipotriene alone does not treat actinic keratoses. However, when it is combined with 5-fluorouracil, it helps the 5-fluorouracil treat the actinic keratoses much faster so that the same results can be achieved with a much shorter treatment time.  INSTRUCTIONS FOR 5-FLUOROURACIL/CALCIPOTRIENE CREAM:   5-fluorouracil/calcipotriene cream typically only needs to be used for 4-7 days. A thin layer should be applied twice a day to the treatment areas recommended by your physician.   If your physician  prescribed you separate tubes of 5-fluourouracil and calcipotriene, apply a thin layer of 5-fluorouracil followed by a thin layer of calcipotriene.   Avoid contact with your eyes, nostrils, and mouth. Do not use 5-fluorouracil/calcipotriene cream on infected or open wounds.   You will develop redness, irritation and some crusting at areas where you have pre-cancer damage/actinic keratoses. IF YOU DEVELOP PAIN, BLEEDING, OR SIGNIFICANT CRUSTING, STOP THE TREATMENT EARLY - you have already gotten a good response and the actinic keratoses should clear up well.  Wash your hands after applying 5-fluorouracil 5% cream on your skin.   A moisturizer or sunscreen with a minimum SPF 30 should be applied each morning.   Once you have finished the treatment, you can apply a thin layer of Vaseline twice a day to irritated areas to soothe and calm the areas more quickly. If you experience significant discomfort, contact your physician.  For some patients it is necessary to repeat the treatment for best results.  SIDE EFFECTS: When using 5-fluorouracil/calcipotriene cream, you may have mild irritation, such as redness, dryness, swelling, or a mild burning sensation. This usually resolves within 2 weeks. The more actinic keratoses you have, the more redness and inflammation you can expect during treatment. Eye irritation has been reported rarely. If this occurs, please let us know.  If you have any trouble using this cream, please call the office. If you have any other questions about this information, please do not hesitate to ask me before you leave the office.  Recommend daily broad spectrum sunscreen SPF 30+ to sun-exposed areas, reapply every 2 hours as needed. Call for new or changing lesions.  Staying in the shade or wearing long sleeves, sun glasses (UVA+UVB protection) and  wide brim hats (4-inch brim around the entire circumference of the hat) are also recommended for sun protection.     Due to recent  changes in healthcare laws, you may see results of your pathology and/or laboratory studies on MyChart before the doctors have had a chance to review them. We understand that in some cases there may be results that are confusing or concerning to you. Please understand that not all results are received at the same time and often the doctors may need to interpret multiple results in order to provide you with the best plan of care or course of treatment. Therefore, we ask that you please give [place] 2 business days to thoroughly review all your results before contacting the office for clarification. Should we see a critical lab result, you will be contacted sooner.   If You Need Anything After Your Visit  If you have any questions or concerns for your doctor, please call our main line at [phone] and press option 4 to reach your doctor's medical assistant. If no one answers, please leave a voicemail as directed and we will return your call as soon as possible. Messages left after 4 pm will be answered the following business day.   You may also send [place] a message via [place]. We typically respond to MyChart messages within 1-2 business days.  For prescription refills, please ask your pharmacy to contact our office. Our fax number is [phone].  If you have an urgent issue when the clinic is closed that cannot wait until the next business day, you can page your doctor at the number below.    Please note that while we do our best to be available for urgent issues outside of office hours, we are not available 24/7.   If you have an urgent issue and are unable to reach [place], you may choose to seek medical care at your doctor's office, retail clinic, urgent care center, or emergency room.  If you have a medical emergency, please immediately call 911 or go to the emergency department.  Pager Numbers  - Dr. Nehemiah Massed: [phone]  - Dr. Laurence Ferrari: [phone]  - Dr. Nicole Kindred: [phone]  In the event of  inclement weather, please call our main line at [phone] for an update on the status of any delays or closures.  Dermatology Medication Tips: Please keep the boxes that topical medications come in in order to help keep track of the instructions about where and how to use these. Pharmacies typically print the medication instructions only on the boxes and not directly on the medication tubes.   If your medication is too expensive, please contact our office at [phone] option 4 or send [place] a message through [place].   We are unable to tell what your co-pay for medications will be in advance as this is different depending on your insurance coverage. However, we may be able to find a substitute medication at lower cost or fill out paperwork to get insurance to cover a needed medication.   If a prior authorization is required to get your medication covered by your insurance company, please allow [place] 1-2 business days to complete this process.  Drug prices often vary depending on where the prescription is filled and some pharmacies may offer cheaper prices.  The website www.goodrx.com contains coupons for medications through different pharmacies. The prices here do not account for what the cost may be with help from insurance (it may be cheaper with your insurance), but the website can  give you the price if you did not use any insurance.  - You can print the associated coupon and take it with your prescription to the pharmacy.  - You may also stop by our office during regular business hours and pick up a GoodRx coupon card.  - If you need your prescription sent electronically to a different pharmacy, notify our office through [facility] or by phone at [phone] option 4.     Si Usted Necesita Algo Despus de Su Visita  Tambin puede enviarnos un mensaje a travs de Pharmacist, community. Por lo general respondemos a los mensajes de MyChart en el transcurso de 1 a 2 das hbiles.  Para renovar  recetas, por favor pida a su farmacia que se ponga en contacto con nuestra oficina. Harland Dingwall de fax es [place] [phone].  Si tiene un asunto urgente cuando la clnica est cerrada y que no puede esperar hasta el siguiente da hbil, puede llamar/localizar a su doctor(a) al nmero que aparece a continuacin.   Por favor, tenga en cuenta que aunque hacemos todo lo posible para estar disponibles para asuntos urgentes fuera del horario de [place], no estamos disponibles las 24 horas del da, los 7 das de la [place].   Si tiene un problema urgente y no puede comunicarse con nosotros, puede optar por buscar atencin mdica  en el consultorio de su doctor(a), en una clnica privada, en un centro de atencin urgente o en una sala de emergencias.  Si tiene Engineering geologist, por favor llame inmediatamente al 911 o vaya a la sala de emergencias.  Nmeros de bper  - Dr. Nehemiah Massed: [phone]  - Dra. Moye: [phone]  - Dra. Nicole Kindred: [phone]  En caso de inclemencias del [place], por favor llame a Johnsie Kindred principal al [phone] para una actualizacin sobre el [place] de cualquier retraso o cierre.  Consejos para la medicacin en dermatologa: Por favor, guarde las cajas en las que vienen los medicamentos de uso tpico para ayudarle a seguir las instrucciones sobre dnde y cmo usarlos. Las farmacias generalmente imprimen las instrucciones del medicamento slo en las cajas y no directamente en los tubos del [place].   Si su medicamento es muy caro, por favor, pngase en contacto con Zigmund Daniel llamando al [phone] y presione la opcin 4 o envenos un mensaje a travs de Pharmacist, community.   No podemos decirle cul ser su copago por los medicamentos por adelantado ya que esto es diferente dependiendo de la cobertura de su seguro. Sin embargo, es posible que podamos encontrar un medicamento sustituto a Electrical engineer un formulario para que el seguro cubra el medicamento  que se considera necesario.   Si se requiere una autorizacin previa para que su compaa de seguros [place] su medicamento, por favor permtanos de 1 a 2 das hbiles para completar este proceso.  Los precios de los medicamentos varan con frecuencia dependiendo del Environmental consultant de dnde se surte la receta y alguna farmacias pueden ofrecer precios ms baratos.  El sitio web www.goodrx.com tiene cupones para medicamentos de Airline pilot. Los precios aqu no tienen en cuenta lo que podra costar con la ayuda del seguro (puede ser ms barato con su seguro), pero el sitio web puede darle el precio si no utiliz Research scientist (physical sciences).  - Puede imprimir el cupn correspondiente y llevarlo con su receta a la farmacia.  - Tambin puede pasar por nuestra oficina durante el horario de atencin regular y Charity fundraiser una tarjeta de cupones de GoodRx.  -  Si necesita que su receta se enve electrnicamente a una farmacia diferente, informe a nuestra oficina a travs de MyChart de [place] o por telfono llamando al [phone] y presione la opcin 4.  

## 2021-11-12 NOTE — Progress Notes (Unsigned)
Follow-Up Visit   Subjective  Johnny Serrano is a 61 y.o. male who presents for the following: Actinic Keratosis (Forehead, scalp. C/O new pink rough spots that may need to be frozen). The patient has spots, moles and lesions to be evaluated, some may be new or changing and the patient has concerns that these could be cancer.  The following portions of the chart were reviewed this encounter and updated as appropriate:  Tobacco  Allergies  Meds  Problems  Med Hx  Surg Hx  Fam Hx     Review of Systems: No other skin or systemic complaints except as noted in HPI or Assessment and Plan.  Objective  Well appearing patient in no apparent distress; mood and affect are within normal limits.  A focused examination was performed including head, including the scalp, face, neck, nose, ears, eyelids, and lips. Relevant physical exam findings are noted in the Assessment and Plan.  right superior forhead x2 (2) Erythematous thin papules/macules with gritty scale.    Assessment & Plan   Actinic Damage - Severe, confluent actinic changes with pre-cancerous actinic keratoses  - Severe, chronic, not at goal, secondary to cumulative UV radiation exposure over time - diffuse scaly erythematous macules and papules with underlying dyspigmentation - Discussed Prescription "Field Treatment" for Severe, Chronic Confluent Actinic Changes with Pre-Cancerous Actinic Keratoses Field treatment involves treatment of an entire area of skin that has confluent Actinic Changes (Sun/ Ultraviolet light damage) and PreCancerous Actinic Keratoses by method of PhotoDynamic Therapy (PDT) and/or prescription Topical Chemotherapy agents such as 5-fluorouracil, 5-fluorouracil/calcipotriene, and/or imiquimod.  The purpose is to decrease the number of clinically evident and subclinical PreCancerous lesions to prevent progression to development of skin cancer by chemically destroying early precancer changes that may or may not  be visible.  It has been shown to reduce the risk of developing skin cancer in the treated area. As a result of treatment, redness, scaling, crusting, and open sores may occur during treatment course. One or more than one of these methods may be used and may have to be used several times to control, suppress and eliminate the PreCancerous changes. Discussed treatment course, expected reaction, and possible side effects. - Recommend daily broad spectrum sunscreen SPF 30+ to sun-exposed areas, reapply every 2 hours as needed.  - Staying in the shade or wearing long sleeves, sun glasses (UVA+UVB protection) and wide brim hats (4-inch brim around the entire circumference of the hat) are also recommended. - Call for new or changing lesions.   Start 5-fluorouracil/calcipotriene cream twice a day for 7 days to affected areas including forehead, frontal scalp. Prescription sent to [facility]. Patient provided with contact information for pharmacy and advised the pharmacy will mail the prescription to their home. Patient provided with handout reviewing treatment course and side effects and advised to call or message [place] on MyChart with any concerns. Start in mid September.   AK (actinic keratosis) (2) right superior forhead x2  Actinic keratoses are precancerous spots that appear secondary to cumulative UV radiation exposure/sun exposure over time. They are chronic with expected duration over 1 year. A portion of actinic keratoses will progress to squamous cell carcinoma of the skin. It is not possible to reliably predict which spots will progress to skin cancer and so treatment is recommended to prevent development of skin cancer.  Recommend daily broad spectrum sunscreen SPF 30+ to sun-exposed areas, reapply every 2 hours as needed.  Recommend staying in the shade or wearing long  sleeves, sun glasses (UVA+UVB protection) and wide brim hats (4-inch brim around the entire circumference of the hat). Call  for new or changing lesions.  fluorouracil (EFUDEX) 5 % cream - right superior forhead x2 Apply topically 2 (two) times daily. Apply twice daily for 7 days to forehead, and frontal scalp  Destruction of lesion - right superior forhead x2 Complexity: simple   Destruction method: cryotherapy   Informed consent: discussed and consent obtained   Timeout:  patient name, date of birth, surgical site, and procedure verified Lesion destroyed using liquid nitrogen: Yes   Region frozen until ice ball extended beyond lesion: Yes   Outcome: patient tolerated procedure well with no complications   Post-procedure details: wound care instructions given     Return for TBSE As Scheduled.  I, Emelia Salisbury, CMA, am acting as scribe for Sarina Ser, MD. Documentation: I have reviewed the above documentation for accuracy and completeness, and I agree with the above.  Sarina Ser, MD

## 2021-11-13 ENCOUNTER — Encounter: Payer: Self-pay | Admitting: Dermatology

## 2021-11-13 ENCOUNTER — Other Ambulatory Visit: Payer: Self-pay

## 2021-11-13 DIAGNOSIS — L570 Actinic keratosis: Secondary | ICD-10-CM

## 2021-11-13 MED ORDER — FLUOROURACIL 5 % EX CREA: TOPICAL_CREAM | Freq: Two times a day (BID) | CUTANEOUS | 2 refills | Status: DC

## 2021-11-13 NOTE — Progress Notes (Signed)
[facility] called needing clarification on Efudex cream. Escripted

## 2022-01-25 ENCOUNTER — Other Ambulatory Visit: Payer: Self-pay | Admitting: Family Medicine

## 2022-01-25 DIAGNOSIS — F32A Depression, unspecified: Secondary | ICD-10-CM

## 2022-01-31 ENCOUNTER — Encounter: Payer: BC Managed Care – PPO | Admitting: Family Medicine

## 2022-02-04 ENCOUNTER — Ambulatory Visit: Payer: BC Managed Care – PPO | Admitting: Dermatology

## 2022-02-27 ENCOUNTER — Ambulatory Visit: Payer: BC Managed Care – PPO | Admitting: Dermatology

## 2022-02-27 VITALS — BP 149/94 | HR 61

## 2022-02-27 DIAGNOSIS — L814 Other melanin hyperpigmentation: Secondary | ICD-10-CM | POA: Diagnosis not present

## 2022-02-27 DIAGNOSIS — D485 Neoplasm of uncertain behavior of skin: Secondary | ICD-10-CM

## 2022-02-27 DIAGNOSIS — L578 Other skin changes due to chronic exposure to nonionizing radiation: Secondary | ICD-10-CM | POA: Diagnosis not present

## 2022-02-27 DIAGNOSIS — Z86018 Personal history of other benign neoplasm: Secondary | ICD-10-CM

## 2022-02-27 DIAGNOSIS — D229 Melanocytic nevi, unspecified: Secondary | ICD-10-CM

## 2022-02-27 DIAGNOSIS — D2271 Melanocytic nevi of right lower limb, including hip: Secondary | ICD-10-CM

## 2022-02-27 DIAGNOSIS — D2272 Melanocytic nevi of left lower limb, including hip: Secondary | ICD-10-CM | POA: Diagnosis not present

## 2022-02-27 DIAGNOSIS — Z1283 Encounter for screening for malignant neoplasm of skin: Secondary | ICD-10-CM | POA: Diagnosis not present

## 2022-02-27 DIAGNOSIS — L821 Other seborrheic keratosis: Secondary | ICD-10-CM

## 2022-02-27 NOTE — Patient Instructions (Addendum)
Wound Care Instructions  Cleanse wound gently with soap and water once a day then pat dry with clean gauze. Apply a thin coat of Petrolatum (petroleum jelly, "Vaseline") over the wound (unless you have an allergy to this). We recommend that you use a new, sterile tube of Vaseline. Do not pick or remove scabs. Do not remove the yellow or white "healing tissue" from the base of the wound.  Cover the wound with fresh, clean, nonstick gauze and secure with paper tape. You may use Band-Aids in place of gauze and tape if the wound is small enough, but would recommend trimming much of the tape off as there is often too much. Sometimes Band-Aids can irritate the skin.  You should call the office for your biopsy report after 1 week if you have not already been contacted.  If you experience any problems, such as abnormal amounts of bleeding, swelling, significant bruising, significant pain, or evidence of infection, please call the office immediately.  FOR ADULT SURGERY PATIENTS: If you need something for pain relief you may take 1 extra strength Tylenol (acetaminophen) AND 2 Ibuprofen (200mg each) together every 4 hours as needed for pain. (do not take these if you are allergic to them or if you have a reason you should not take them.) Typically, you may only need pain medication for 1 to 3 days.     Due to recent changes in healthcare laws, you may see results of your pathology and/or laboratory studies on MyChart before the doctors have had a chance to review them. We understand that in some cases there may be results that are confusing or concerning to you. Please understand that not all results are received at the same time and often the doctors may need to interpret multiple results in order to provide you with the best plan of care or course of treatment. Therefore, we ask that you please give us 2 business days to thoroughly review all your results before contacting the office for clarification. Should  we see a critical lab result, you will be contacted sooner.   If You Need Anything After Your Visit  If you have any questions or concerns for your doctor, please call our main line at [phone] and press option 4 to reach your doctor's medical assistant. If no one answers, please leave a voicemail as directed and we will return your call as soon as possible. Messages left after 4 pm will be answered the following business day.   You may also send us a message via MyChart. We typically respond to MyChart messages within 1-2 business days.  For prescription refills, please ask your pharmacy to contact our office. Our fax number is [phone].  If you have an urgent issue when the clinic is closed that cannot wait until the next business day, you can page your doctor at the number below.    Please note that while we do our best to be available for urgent issues outside of office hours, we are not available 24/7.   If you have an urgent issue and are unable to reach us, you may choose to seek medical care at your doctor's office, retail clinic, urgent care center, or emergency room.  If you have a medical emergency, please immediately call 911 or go to the emergency department.  Pager Numbers  - Dr. Kowalski: [phone]  - Dr. Moye: [phone]  - Dr. Stewart: [phone]  In the event of inclement weather, please call our main line at   [phone] for an update on the status of any delays or closures.  Dermatology Medication Tips: Please keep the boxes that topical medications come in in order to help keep track of the instructions about where and how to use these. Pharmacies typically print the medication instructions only on the boxes and not directly on the medication tubes.   If your medication is too expensive, please contact our office at [phone] option 4 or send us a message through MyChart.   We are unable to tell what your co-pay for medications will be in  advance as this is different depending on your insurance coverage. However, we may be able to find a substitute medication at lower cost or fill out paperwork to get insurance to cover a needed medication.   If a prior authorization is required to get your medication covered by your insurance company, please allow us 1-2 business days to complete this process.  Drug prices often vary depending on where the prescription is filled and some pharmacies may offer cheaper prices.  The website www.goodrx.com contains coupons for medications through different pharmacies. The prices here do not account for what the cost may be with help from insurance (it may be cheaper with your insurance), but the website can give you the price if you did not use any insurance.  - You can print the associated coupon and take it with your prescription to the pharmacy.  - You may also stop by our office during regular business hours and pick up a GoodRx coupon card.  - If you need your prescription sent electronically to a different pharmacy, notify our office through [place] MyChart or by phone at [phone] option 4.     Si Usted Necesita Algo Despus de Su Visita  Tambin puede enviarnos un mensaje a travs de MyChart. Por lo general respondemos a los mensajes de MyChart en el transcurso de 1 a 2 das hbiles.  Para renovar recetas, por favor pida a su farmacia que se ponga en contacto con nuestra oficina. Nuestro nmero de fax es el [phone].  Si tiene un asunto urgente cuando la clnica est cerrada y que no puede esperar hasta el siguiente da hbil, puede llamar/localizar a su doctor(a) al nmero que aparece a continuacin.   Por favor, tenga en cuenta que aunque hacemos todo lo posible para estar disponibles para asuntos urgentes fuera del horario de oficina, no estamos disponibles las 24 horas del da, los 7 das de la semana.   Si tiene un problema urgente y no puede comunicarse con nosotros, puede  optar por buscar atencin mdica  en el consultorio de su doctor(a), en una clnica privada, en un centro de atencin urgente o en una sala de emergencias.  Si tiene una emergencia mdica, por favor llame inmediatamente al 911 o vaya a la sala de emergencias.  Nmeros de bper  - Dr. Kowalski: [phone]  - Dra. Moye: [phone]  - Dra. Stewart: [phone]  En caso de inclemencias del tiempo, por favor llame a nuestra lnea principal al [phone] para una actualizacin sobre el estado de cualquier retraso o cierre.  Consejos para la medicacin en dermatologa: Por favor, guarde las cajas en las que vienen los medicamentos de uso tpico para ayudarle a seguir las instrucciones sobre dnde y cmo usarlos. Las farmacias generalmente imprimen las instrucciones del medicamento slo en las cajas y no directamente en los tubos del medicamento.   Si su medicamento es muy caro, por favor, pngase en contacto con   nuestra oficina llamando al [phone] y presione la opcin 4 o envenos un mensaje a travs de MyChart.   No podemos decirle cul ser su copago por los medicamentos por adelantado ya que esto es diferente dependiendo de la cobertura de su seguro. Sin embargo, es posible que podamos encontrar un medicamento sustituto a menor costo o llenar un formulario para que el seguro cubra el medicamento que se considera necesario.   Si se requiere una autorizacin previa para que su compaa de seguros cubra su medicamento, por favor permtanos de 1 a 2 das hbiles para completar este proceso.  Los precios de los medicamentos varan con frecuencia dependiendo del lugar de dnde se surte la receta y alguna farmacias pueden ofrecer precios ms baratos.  El sitio web www.goodrx.com tiene cupones para medicamentos de diferentes farmacias. Los precios aqu no tienen en cuenta lo que podra costar con la ayuda del seguro (puede ser ms barato con su seguro), pero el sitio web puede darle el  precio si no utiliz ningn seguro.  - Puede imprimir el cupn correspondiente y llevarlo con su receta a la farmacia.  - Tambin puede pasar por nuestra oficina durante el horario de atencin regular y recoger una tarjeta de cupones de GoodRx.  - Si necesita que su receta se enve electrnicamente a una farmacia diferente, informe a nuestra oficina a travs de MyChart de [place] o por telfono llamando al [phone] y presione la opcin 4.  

## 2022-02-27 NOTE — Progress Notes (Signed)
Follow-Up Visit   Subjective  Johnny Serrano is a 61 y.o. male who presents for the following: Annual Exam (Hx dysplastic nevus and Aks ). The patient presents for Total-Body Skin Exam (TBSE) for skin cancer screening and mole check.  The patient has spots, moles and lesions to be evaluated, some may be new or changing and the patient has concerns that these could be cancer.  The following portions of the chart were reviewed this encounter and updated as appropriate:   Tobacco  Allergies  Meds  Problems  Med Hx  Surg Hx  Fam Hx     Review of Systems:  No other skin or systemic complaints except as noted in HPI or Assessment and Plan.  Objective  Well appearing patient in no apparent distress; mood and affect are within normal limits.  A full examination was performed including scalp, head, eyes, ears, nose, lips, neck, chest, axillae, abdomen, back, buttocks, bilateral upper extremities, bilateral lower extremities, hands, feet, fingers, toes, fingernails, and toenails. All findings within normal limits unless otherwise noted below.  L lat dorsum foot 0.3 cm Irregular brown macule.     R lat dorsum foot 0.3 cm Regular brown macule.      Assessment & Plan  Neoplasm of uncertain behavior of skin L lat dorsum foot  Epidermal / dermal shaving  Lesion diameter (cm):  0.3 Informed consent: discussed and consent obtained   Timeout: patient name, date of birth, surgical site, and procedure verified   Procedure prep:  Patient was prepped and draped in usual sterile fashion Prep type:  Isopropyl alcohol Anesthesia: the lesion was anesthetized in a standard fashion   Anesthetic:  1% lidocaine w/ epinephrine 1-100,000 buffered w/ 8.4% NaHCO3 Instrument used: flexible razor blade   Hemostasis achieved with: pressure, aluminum chloride and electrodesiccation   Outcome: patient tolerated procedure well   Post-procedure details: sterile dressing applied and wound care  instructions given   Dressing type: bandage and petrolatum    Specimen 1 - Surgical pathology Differential Diagnosis: D48.5 r/o dysplastic nevus vs other Check Margins: No  Nevus R lat dorsum foot  Benign-appearing.  Observation.  Call clinic for new or changing moles.  Recommend daily use of broad spectrum spf 30+ sunscreen to sun-exposed areas.    Lentigines - Scattered tan macules - Due to sun exposure - Benign-appearing, observe - Recommend daily broad spectrum sunscreen SPF 30+ to sun-exposed areas, reapply every 2 hours as needed. - Call for any changes  Seborrheic Keratoses - Stuck-on, waxy, tan-brown papules and/or plaques  - Benign-appearing - Discussed benign etiology and prognosis. - Observe - Call for any changes  Melanocytic Nevi - Tan-brown and/or pink-flesh-colored symmetric macules and papules - Benign appearing on exam today - Observation - Call clinic for new or changing moles - Recommend daily use of broad spectrum spf 30+ sunscreen to sun-exposed areas.   Hemangiomas - Red papules - Discussed benign nature - Observe - Call for any changes  Actinic Damage - Chronic condition, secondary to cumulative UV/sun exposure - diffuse scaly erythematous macules with underlying dyspigmentation - Recommend daily broad spectrum sunscreen SPF 30+ to sun-exposed areas, reapply every 2 hours as needed.  - Staying in the shade or wearing long sleeves, sun glasses (UVA+UVB protection) and wide brim hats (4-inch brim around the entire circumference of the hat) are also recommended for sun protection.  - Call for new or changing lesions.  History of Dysplastic Nevi - No evidence of recurrence today - Recommend  regular full body skin exams - Recommend daily broad spectrum sunscreen SPF 30+ to sun-exposed areas, reapply every 2 hours as needed.  - Call if any new or changing lesions are noted between office visits  Skin cancer screening performed today.  Return in  about 1 year (around 02/28/2023) for TBSE.  Luther Redo, CMA, am acting as scribe for Sarina Ser, MD . Documentation: I have reviewed the above documentation for accuracy and completeness, and I agree with the above.  Sarina Ser, MD

## 2022-03-05 ENCOUNTER — Telehealth: Payer: Self-pay

## 2022-03-05 NOTE — Telephone Encounter (Signed)
-----   Message from Ralene Bathe, MD sent at 03/05/2022 10:36 AM EST ----- Diagnosis Skin , left lat dorsum foot DYSPLASTIC JUNCTIONAL NEVUS WITH MODERATE ATYPIA, LIMITED MARGINS FREE  Moderate dysplastic Recheck next visit

## 2022-03-05 NOTE — Telephone Encounter (Signed)
Discussed biopsy results with patient 

## 2022-03-10 ENCOUNTER — Encounter: Payer: Self-pay | Admitting: Dermatology

## 2023-01-14 ENCOUNTER — Telehealth: Payer: Self-pay

## 2023-01-14 DIAGNOSIS — L570 Actinic keratosis: Secondary | ICD-10-CM

## 2023-01-14 MED ORDER — FLUOROURACIL 5 % EX CREA: TOPICAL_CREAM | Freq: Two times a day (BID) | CUTANEOUS | 2 refills | Status: DC

## 2023-01-14 NOTE — Telephone Encounter (Signed)
Patient wife calling requesting a refill of Efudex  cream, ok will send 64fu/Calcipotriene cream [facility]   Instructions for [facility] Medications  One or more of your medications was sent to the [facility] mail order compounding pharmacy. You will receive an email from them and can purchase the medicine through that link. It will then be mailed to your home at the address you confirmed. If for any reason you do not receive an email from them, please check your spam folder. If you still do not find the email, please let us know. [facility] phone number is [phone].

## 2023-02-19 ENCOUNTER — Ambulatory Visit: Payer: BC Managed Care – PPO | Admitting: Dermatology

## 2023-02-25 ENCOUNTER — Ambulatory Visit: Payer: BC Managed Care – PPO | Admitting: Dermatology

## 2023-02-25 ENCOUNTER — Encounter: Payer: Self-pay | Admitting: Dermatology

## 2023-02-25 DIAGNOSIS — H01136 Eczematous dermatitis of left eye, unspecified eyelid: Secondary | ICD-10-CM | POA: Diagnosis not present

## 2023-02-25 DIAGNOSIS — L719 Rosacea, unspecified: Secondary | ICD-10-CM | POA: Diagnosis not present

## 2023-02-25 MED ORDER — PIMECROLIMUS 1 % EX CREA: TOPICAL_CREAM | CUTANEOUS | 2 refills | Status: DC

## 2023-02-25 NOTE — Patient Instructions (Addendum)
Start Pimecrolimus: Apply once or twice daily to eyelids/around eyes and affected areas on face until resolved. Resume as needed for flares.    Okay to continue Aquaphor as needed.     Gentle Skin Care Guide  1. Bathe no more than once a day.  2. Avoid bathing in hot water  3. Use a mild soap like Dove, Vanicream, Cetaphil, CeraVe. Can use Lever 2000 or Cetaphil antibacterial soap  4. Use soap only where you need it. On most days, use it under your arms, between your legs, and on your feet. Let the water rinse other areas unless visibly dirty.  5. When you get out of the bath/shower, use a towel to gently blot your skin dry, don't rub it.  6. While your skin is still a little damp, apply a moisturizing cream such as Vanicream, CeraVe, Cetaphil, Eucerin, Sarna lotion or plain Vaseline Jelly. For hands apply Neutrogena [place] Hand Cream or Excipial Hand Cream.  7. Reapply moisturizer any time you start to itch or feel dry.  8. Sometimes using free and clear laundry detergents can be helpful. Fabric softener sheets should be avoided. Downy Free & Gentle liquid, or any liquid fabric softener that is free of dyes and perfumes, it acceptable to use  9. If your doctor has given you prescription creams you may apply moisturizers over them      Due to recent changes in healthcare laws, you may see results of your pathology and/or laboratory studies on MyChart before the doctors have had a chance to review them. We understand that in some cases there may be results that are confusing or concerning to you. Please understand that not all results are received at the same time and often the doctors may need to interpret multiple results in order to provide you with the best plan of care or course of treatment. Therefore, we ask that you please give [place] 2 business days to thoroughly review all your results before contacting the office for clarification. Should we see a critical lab result, you will  be contacted sooner.   If You Need Anything After Your Visit  If you have any questions or concerns for your doctor, please call our main line at [phone] and press option 4 to reach your doctor's medical assistant. If no one answers, please leave a voicemail as directed and we will return your call as soon as possible. Messages left after 4 pm will be answered the following business day.   You may also send [place] a message via MyChart. We typically respond to MyChart messages within 1-2 business days.  For prescription refills, please ask your pharmacy to contact our office. Our fax number is [phone].  If you have an urgent issue when the clinic is closed that cannot wait until the next business day, you can page your doctor at the number below.    Please note that while we do our best to be available for urgent issues outside of office hours, we are not available 24/7.   If you have an urgent issue and are unable to reach [place], you may choose to seek medical care at your doctor's office, retail clinic, urgent care center, or emergency room.  If you have a medical emergency, please immediately call 911 or go to the emergency department.  Pager Numbers  - Dr. Gwen Pounds: [phone]  - Dr. Roseanne Reno: [phone]  - Dr. Katrinka Blazing: [phone]   In the event of inclement weather, please call our main line  at [phone] for an update on the status of any delays or closures.  Dermatology Medication Tips: Please keep the boxes that topical medications come in in order to help keep track of the instructions about where and how to use these. Pharmacies typically print the medication instructions only on the boxes and not directly on the medication tubes.   If your medication is too expensive, please contact our office at [phone] option 4 or send [place] a message through MyChart.   We are unable to tell what your co-pay for medications will be in advance as this is different depending on  your insurance coverage. However, we may be able to find a substitute medication at lower cost or fill out paperwork to get insurance to cover a needed medication.   If a prior authorization is required to get your medication covered by your insurance company, please allow [place] 1-2 business days to complete this process.  Drug prices often vary depending on where the prescription is filled and some pharmacies may offer cheaper prices.  The website www.goodrx.com contains coupons for medications through different pharmacies. The prices here do not account for what the cost may be with help from insurance (it may be cheaper with your insurance), but the website can give you the price if you did not use any insurance.  - You can print the associated coupon and take it with your prescription to the pharmacy.  - You may also stop by our office during regular business hours and pick up a GoodRx coupon card.  - If you need your prescription sent electronically to a different pharmacy, notify our office through [facility] or by phone at [phone] option 4.     Si Usted Necesita Algo Despus de Su Visita  Tambin puede enviarnos un mensaje a travs de Clinical cytogeneticist. Por lo general respondemos a los mensajes de MyChart en el transcurso de 1 a 2 das hbiles.  Para renovar recetas, por favor pida a su farmacia que se ponga en contacto con nuestra oficina. Annie Sable de fax es [place] [phone].  Si tiene un asunto urgente cuando la clnica est cerrada y que no puede esperar hasta el siguiente da hbil, puede llamar/localizar a su doctor(a) al nmero que aparece a continuacin.   Por favor, tenga en cuenta que aunque hacemos todo lo posible para estar disponibles para asuntos urgentes fuera del horario de [place], no estamos disponibles las 24 horas del da, los 7 [address]  [address] de la [place].   Si tiene un problema urgente y no puede comunicarse con nosotros, puede optar por buscar atencin mdica  en el  consultorio de su doctor(a), en una clnica privada, en un centro de atencin urgente o en una sala de emergencias.  Si tiene Engineer, drilling, por favor llame inmediatamente al 911 o vaya a la sala de emergencias.  Nmeros de bper  - Dr. Gwen Pounds: [phone]  - Dra. Roseanne Reno: [phone]  - Dr. Katrinka Blazing: [phone]   En caso de inclemencias del tiempo, por favor llame a Lacy Duverney principal al [phone] para una actualizacin sobre el [place] de cualquier retraso o cierre.  Consejos para la medicacin en dermatologa: Por favor, guarde las cajas en las que vienen los medicamentos de uso tpico para ayudarle a seguir las instrucciones sobre dnde y cmo usarlos. Las farmacias generalmente imprimen las instrucciones del medicamento slo en las cajas y no directamente en los tubos del [place].   Si su medicamento es [employer], por favor, pngase en  contacto con Rolm Gala llamando al [phone] y presione la opcin 4 o envenos un mensaje a travs de Clinical cytogeneticist.   No podemos decirle cul ser su copago por los medicamentos por adelantado ya que esto es diferente dependiendo de la cobertura de su seguro. Sin embargo, es posible que podamos encontrar un medicamento sustituto a Audiological scientist un formulario para que el seguro cubra el medicamento que se considera necesario.   Si se requiere una autorizacin previa para que su compaa de seguros [place] su medicamento, por favor permtanos de 1 a 2 das hbiles para completar [address]  Los precios de los medicamentos varan con frecuencia dependiendo del Environmental consultant de dnde se surte la receta y alguna farmacias pueden ofrecer precios ms baratos.  El sitio web www.goodrx.com tiene cupones para medicamentos de Health and safety inspector. Los precios aqu no tienen en cuenta lo que podra costar con la ayuda del seguro (puede ser ms barato con su seguro), pero el sitio web puede darle el precio si no utiliz Tourist information centre manager.  -  Puede imprimir el cupn correspondiente y llevarlo con su receta a la farmacia.  - Tambin puede pasar por nuestra oficina durante el horario de atencin regular y Education officer, museum una tarjeta de cupones de GoodRx.  - Si necesita que su receta se enve electrnicamente a una farmacia diferente, informe a nuestra oficina a travs de MyChart de [place] o por telfono llamando al [phone] y presione la opcin 4.

## 2023-02-25 NOTE — Progress Notes (Signed)
   Follow-Up Visit   Subjective  Johnny Serrano is a 62 y.o. male who presents for the following: upper and lower eyelids and below eyes. C/O skin getting dry and tender to touch. Was breaking open and bleeding. Started Sunday a week ago. Was itching at first. Using Aquaphor, seems to help some. Uses moisturizer with spf 30 daily.  No personal hx of eczema or asthma. Does have seasonal allergies since college.   The patient has spots, moles and lesions to be evaluated, some may be new or changing and the patient may have concern these could be cancer.   The following portions of the chart were reviewed this encounter and updated as appropriate: medications, allergies, medical history  Review of Systems:  No other skin or systemic complaints except as noted in HPI or Assessment and Plan.  Objective  Well appearing patient in no apparent distress; mood and affect are within normal limits.  A focused examination was performed of the following areas: Face   Relevant exam findings are noted in the Assessment and Plan.    Assessment & Plan   Eyelid Dermatitis/Eczema  Exam: Light pink scaly patches at left periocular and left cheek.   Treatment Plan:  Start Pimecrolimus: Apply once or twice daily to eyelids/around eyes and affected areas on face until resolved. Resume as needed for flares.   May continue Aquaphor ointment as needed Mild soap when washes face  Patient will have Dr. Katrinka Blazing recheck at his next scheduled visit.   ROSACEA Exam Mid face erythema with telangiectasias and few small papules.  Chronic and persistent condition with duration or expected duration over one year. Condition is improving with treatment but not currently at goal.  Rosacea is a chronic progressive skin condition usually affecting the face of adults, causing redness and/or acne bumps. It is treatable but not curable. It sometimes affects the eyes (ocular rosacea) as well. It may respond to topical  and/or systemic medication and can flare with stress, sun exposure, alcohol, exercise, topical steroids (including hydrocortisone/cortisone 10) and some foods.  Daily application of broad spectrum spf 30+ sunscreen to face is recommended to reduce flares.  Patient denies grittiness of the eyes  Treatment Plan Continue Metronidazole cream as directed every day/bid.    Return for TBSE As Scheduled.  I, Lawson Radar, CMA, am acting as scribe for Willeen Niece, MD.   Documentation: I have reviewed the above documentation for accuracy and completeness, and I agree with the above.  Willeen Niece, MD

## 2023-03-19 ENCOUNTER — Encounter: Payer: Self-pay | Admitting: Dermatology

## 2023-03-19 ENCOUNTER — Ambulatory Visit: Payer: BC Managed Care – PPO | Admitting: Dermatology

## 2023-03-19 DIAGNOSIS — D2371 Other benign neoplasm of skin of right lower limb, including hip: Secondary | ICD-10-CM

## 2023-03-19 DIAGNOSIS — L814 Other melanin hyperpigmentation: Secondary | ICD-10-CM

## 2023-03-19 DIAGNOSIS — L578 Other skin changes due to chronic exposure to nonionizing radiation: Secondary | ICD-10-CM

## 2023-03-19 DIAGNOSIS — L821 Other seborrheic keratosis: Secondary | ICD-10-CM

## 2023-03-19 DIAGNOSIS — D239 Other benign neoplasm of skin, unspecified: Secondary | ICD-10-CM

## 2023-03-19 DIAGNOSIS — R21 Rash and other nonspecific skin eruption: Secondary | ICD-10-CM | POA: Diagnosis not present

## 2023-03-19 DIAGNOSIS — Z1283 Encounter for screening for malignant neoplasm of skin: Secondary | ICD-10-CM

## 2023-03-19 DIAGNOSIS — Z7189 Other specified counseling: Secondary | ICD-10-CM

## 2023-03-19 DIAGNOSIS — L570 Actinic keratosis: Secondary | ICD-10-CM | POA: Diagnosis not present

## 2023-03-19 DIAGNOSIS — D229 Melanocytic nevi, unspecified: Secondary | ICD-10-CM

## 2023-03-19 DIAGNOSIS — W908XXA Exposure to other nonionizing radiation, initial encounter: Secondary | ICD-10-CM

## 2023-03-19 DIAGNOSIS — D1801 Hemangioma of skin and subcutaneous tissue: Secondary | ICD-10-CM

## 2023-03-19 DIAGNOSIS — Z86018 Personal history of other benign neoplasm: Secondary | ICD-10-CM

## 2023-03-19 NOTE — Progress Notes (Signed)
Follow-Up Visit   Subjective  Johnny Serrano is a 62 y.o. male who presents for the following: Skin Cancer Screening and Full Body Skin Exam  The patient presents for Total-Body Skin Exam (TBSE) for skin cancer screening and mole check. The patient has spots, moles and lesions to be evaluated, some may be new or changing and the patient may have concern these could be cancer.  Hx dysplastic nevus, [place]. Patient saw Dr. Roseanne Reno a few weeks ago and was given pimecrolimus for eyelid dermatitis but it burned when patient used it and he thinks it made it worse. Patient has not changed any facial products. Patient also broken out at face, using metronidazole for rosacea.   The following portions of the chart were reviewed this encounter and updated as appropriate: medications, allergies, medical history  Review of Systems:  No other skin or systemic complaints except as noted in HPI or Assessment and Plan.  Objective  Well appearing patient in no apparent distress; mood and affect are within normal limits.  A full examination was performed including scalp, head, eyes, ears, nose, lips, neck, chest, axillae, abdomen, back, buttocks, bilateral upper extremities, bilateral lower extremities, hands, feet, fingers, toes, fingernails, and toenails. All findings within normal limits unless otherwise noted below.   Relevant physical exam findings are noted in the Assessment and Plan.    Assessment & Plan   SKIN CANCER SCREENING PERFORMED TODAY.  ACTINIC DAMAGE WITH PRECANCEROUS ACTINIC KERATOSES Counseling for Topical Chemotherapy Management: Patient exhibits: - Severe, confluent actinic changes with pre-cancerous actinic keratoses that is secondary to cumulative UV radiation exposure over time - Condition that is severe; chronic, not at goal. - diffuse scaly erythematous macules and papules with underlying dyspigmentation - Discussed Prescription "Field Treatment" topical Chemotherapy for  Severe, Chronic Confluent Actinic Changes with Pre-Cancerous Actinic Keratoses Field treatment involves treatment of an entire area of skin that has confluent Actinic Changes (Sun/ Ultraviolet light damage) and PreCancerous Actinic Keratoses by method of PhotoDynamic Therapy (PDT) and/or prescription Topical Chemotherapy agents such as 5-fluorouracil, 5-fluorouracil/calcipotriene, and/or imiquimod.  The purpose is to decrease the number of clinically evident and subclinical PreCancerous lesions to prevent progression to development of skin cancer by chemically destroying early precancer changes that may or may not be visible.  It has been shown to reduce the risk of developing skin cancer in the treated area. As a result of treatment, redness, scaling, crusting, and open sores may occur during treatment course. One or more than one of these methods may be used and may have to be used several times to control, suppress and eliminate the PreCancerous changes. Discussed treatment course, expected reaction, and possible side effects. - Recommend daily broad spectrum sunscreen SPF 30+ to sun-exposed areas, reapply every 2 hours as needed.  - Staying in the shade or wearing long sleeves, sun glasses (UVA+UVB protection) and wide brim hats (4-inch brim around the entire circumference of the hat) are also recommended. - Call for new or changing lesions. - WILL WAIT for current lesions to heal (rash below) and then consider treatment forehead temples ears chest upper back   LENTIGINES, SEBORRHEIC KERATOSES, HEMANGIOMAS - Benign normal skin lesions - Benign-appearing - Call for any changes  MELANOCYTIC NEVI - Tan-brown and/or pink-flesh-colored symmetric macules and papules - Benign appearing on exam today - Observation - Call clinic for new or changing moles - Recommend daily use of broad spectrum spf 30+ sunscreen to sun-exposed areas.   History of Dysplastic Nevi - No  evidence of recurrence today -  Recommend regular full body skin exams - Recommend daily broad spectrum sunscreen SPF 30+ to sun-exposed areas, reapply every 2 hours as needed.  - Call if any new or changing lesions are noted between office visits  DERMATOFIBROMA Exam: Firm pink/brown papulenodule with dimple sign right lower leg. Treatment Plan: A dermatofibroma is a benign growth possibly related to trauma, such as an insect bite, cut from shaving, or inflamed acne-type bump.  Treatment options to remove include shave or excision with resulting scar and risk of recurrence.  Since benign-appearing and not bothersome, will observe for now.   Rash Exam: violaceous scaly patches on right cheek and left infraorbital (this lesion has erosion)          Differential diagnosis:  Lichenoid actinic keratoses from fluorouracil  Treatment Plan: Stop all topicals and let skin heal. Lesions are asymptomatic so topical steroid is not needed   Patient had refill of 5FU/calcipotriene filled in October through [facility]. Likely that he has been using the 5FU/calcipotriene in place of triple cream from [facility].  Triple cream was last sent in 12/2021. Patient advised to take photos of what he's been using and send via MyChart.    Return in about 1 year (around 03/18/2024) for TBSE, Hx Dysplastic Nevi, Hx AK, Rosacea.  Anise Salvo, RMA, am acting as scribe for Elie Goody, MD .   Documentation: I have reviewed the above documentation for accuracy and completeness, and I agree with the above.  Elie Goody, MD

## 2023-03-19 NOTE — Patient Instructions (Signed)

## 2023-04-14 ENCOUNTER — Telehealth: Payer: Self-pay

## 2023-04-14 MED ORDER — AZELAIC ACID 15 % EX GEL: CUTANEOUS | 3 refills | Status: AC

## 2023-04-14 NOTE — Telephone Encounter (Signed)
 Patient's wife in office today. Requested Rf of husbands triple cream for rosacea from [facility]. 3 Rf sent.

## 2024-03-17 ENCOUNTER — Ambulatory Visit: Payer: BC Managed Care – PPO | Admitting: Dermatology

## 2024-03-17 ENCOUNTER — Ambulatory Visit: Admitting: Dermatology

## 2024-03-23 ENCOUNTER — Ambulatory Visit (INDEPENDENT_AMBULATORY_CARE_PROVIDER_SITE_OTHER): Admitting: Dermatology

## 2024-03-23 ENCOUNTER — Encounter: Payer: Self-pay | Admitting: Dermatology

## 2024-03-23 DIAGNOSIS — L853 Xerosis cutis: Secondary | ICD-10-CM | POA: Diagnosis not present

## 2024-03-23 DIAGNOSIS — Z86018 Personal history of other benign neoplasm: Secondary | ICD-10-CM

## 2024-03-23 DIAGNOSIS — L578 Other skin changes due to chronic exposure to nonionizing radiation: Secondary | ICD-10-CM

## 2024-03-23 DIAGNOSIS — L570 Actinic keratosis: Secondary | ICD-10-CM

## 2024-03-23 DIAGNOSIS — Z1283 Encounter for screening for malignant neoplasm of skin: Secondary | ICD-10-CM

## 2024-03-23 DIAGNOSIS — D1801 Hemangioma of skin and subcutaneous tissue: Secondary | ICD-10-CM

## 2024-03-23 DIAGNOSIS — D229 Melanocytic nevi, unspecified: Secondary | ICD-10-CM

## 2024-03-23 DIAGNOSIS — D239 Other benign neoplasm of skin, unspecified: Secondary | ICD-10-CM

## 2024-03-23 DIAGNOSIS — D2361 Other benign neoplasm of skin of right upper limb, including shoulder: Secondary | ICD-10-CM | POA: Diagnosis not present

## 2024-03-23 DIAGNOSIS — D2371 Other benign neoplasm of skin of right lower limb, including hip: Secondary | ICD-10-CM

## 2024-03-23 DIAGNOSIS — L719 Rosacea, unspecified: Secondary | ICD-10-CM | POA: Diagnosis not present

## 2024-03-23 DIAGNOSIS — L814 Other melanin hyperpigmentation: Secondary | ICD-10-CM

## 2024-03-23 DIAGNOSIS — W908XXA Exposure to other nonionizing radiation, initial encounter: Secondary | ICD-10-CM

## 2024-03-23 DIAGNOSIS — L821 Other seborrheic keratosis: Secondary | ICD-10-CM | POA: Diagnosis not present

## 2024-03-23 NOTE — Progress Notes (Signed)
 "  Follow-Up Visit   Subjective  Johnny Serrano is a 63 y.o. male who presents for the following: Skin Cancer Screening and Full Body Skin Exam; hx of Dysplastic nevi and AK's. Patient reports two areas of concern on the neck and one on the back.   The patient presents for Total-Body Skin Exam (TBSE) for skin cancer screening and mole check. The patient has spots, moles and lesions to be evaluated, some may be new or changing and the patient may have concern these could be cancer.    The following portions of the chart were reviewed this encounter and updated as appropriate: medications, allergies, medical history  Review of Systems:  No other skin or systemic complaints except as noted in HPI or Assessment and Plan.  Objective  Well appearing patient in no apparent distress; mood and affect are within normal limits.  A full examination was performed including scalp, head, eyes, ears, nose, lips, neck, chest, axillae, abdomen, back, buttocks, bilateral upper extremities, bilateral lower extremities, hands, feet, fingers, toes, fingernails, and toenails. All findings within normal limits unless otherwise noted below.   Relevant physical exam findings are noted in the Assessment and Plan.  Left temple x1, left frontal scalp x2, left forehead x2, right temple x2, right preauricular x1, right frontal scalp x3, central frontal scalp x6, (17) Pink scaly macules  Assessment & Plan   SKIN CANCER SCREENING PERFORMED TODAY.  ACTINIC DAMAGE - Chronic condition, secondary to cumulative UV/sun exposure - diffuse scaly erythematous macules with underlying dyspigmentation - Recommend daily broad spectrum sunscreen SPF 30+ to sun-exposed areas, reapply every 2 hours as needed.  - Staying in the shade or wearing long sleeves, sun glasses (UVA+UVB protection) and wide brim hats (4-inch brim around the entire circumference of the hat) are also recommended for sun protection.  - Call for new or  changing lesions.  LENTIGINES, SEBORRHEIC KERATOSES, HEMANGIOMAS - Benign normal skin lesions - Benign-appearing - Call for any changes  MELANOCYTIC NEVI - Tan-brown and/or pink-flesh-colored symmetric macules and papules - Benign appearing on exam today - Observation - Call clinic for new or changing moles - Recommend daily use of broad spectrum spf 30+ sunscreen to sun-exposed areas.   History of Dysplastic Nevi - No evidence of recurrence today - Recommend regular full body skin exams - Recommend daily broad spectrum sunscreen SPF 30+ to sun-exposed areas, reapply every 2 hours as needed.  - Call if any new or changing lesions are noted between office visits   DERMATOFIBROMA Exam: Firm pink/brown papulenodule with dimple sign right lower leg and right upper arm. Treatment Plan: A dermatofibroma is a benign growth possibly related to trauma, such as an insect bite, cut from shaving, or inflamed acne-type bump.  Treatment options to remove include shave or excision with resulting scar and risk of recurrence.  Since benign-appearing and not bothersome, will observe for now.   Xerosis - diffuse xerotic patches at the back and posterior neck - recommend gentle, hydrating skin care - gentle skin care handout given  ROSACEA Exam Clear on exam   Chronic condition with duration or expected duration over one year. Currently well-controlled.  Rosacea is a chronic progressive skin condition usually affecting the face of adults, causing redness and/or acne bumps. It is treatable but not curable. It sometimes affects the eyes (ocular rosacea) as well. It may respond to topical and/or systemic medication and can flare with stress, sun exposure, alcohol, exercise, topical steroids (including hydrocortisone/cortisone 10) and some foods.  Daily application of broad spectrum spf 30+ sunscreen to face is recommended to reduce flares.  Patient denies grittiness of the eyes  Treatment  Plan Continue Azelaic Acid  15% / Metronidazole  1% / Ivermectin 1% Cream from [facility]  apply topically twice daily to the face   ACTINIC KERATOSIS (17) Left temple x1, left frontal scalp x2, left forehead x2, right temple x2, right preauricular x1, right frontal scalp x3, central frontal scalp x6, (17) Actinic keratoses are precancerous spots that appear secondary to cumulative UV radiation exposure/sun exposure over time. They are chronic with expected duration over 1 year. A portion of actinic keratoses will progress to squamous cell carcinoma of the skin. It is not possible to reliably predict which spots will progress to skin cancer and so treatment is recommended to prevent development of skin cancer.  Recommend daily broad spectrum sunscreen SPF 30+ to sun-exposed areas, reapply every 2 hours as needed.  Recommend staying in the shade or wearing long sleeves, sun glasses (UVA+UVB protection) and wide brim hats (4-inch brim around the entire circumference of the hat). Call for new or changing lesions. - Destruction of lesion - Left temple x1, left frontal scalp x2, left forehead x2, right temple x2, right preauricular x1, right frontal scalp x3, central frontal scalp x6, (17) Complexity: simple   Destruction method: cryotherapy   Informed consent: discussed and consent obtained   Timeout:  patient name, date of birth, surgical site, and procedure verified Lesion destroyed using liquid nitrogen: Yes   Region frozen until ice ball extended beyond lesion: Yes   Cryo cycles: 1 or 2. Outcome: patient tolerated procedure well with no complications   Post-procedure details: wound care instructions given    MULTIPLE BENIGN NEVI   LENTIGINES   ACTINIC ELASTOSIS   SEBORRHEIC KERATOSES   CHERRY ANGIOMA   DERMATOFIBROMA   ROSACEA   Return in about 1 year (around 03/23/2025) for TBSE.  I, Emerick Ege, CMA am acting as scribe for Boneta Sharps, MD.   Documentation:  I have reviewed the above documentation for accuracy and completeness, and I agree with the above.  Boneta Sharps, MD    "

## 2024-03-23 NOTE — Patient Instructions (Addendum)
 Gentle Skin Care Guide  1. Bathe no more than once a day.  2. Avoid bathing in hot water  3. Use a mild soap like Dove, Vanicream, Cetaphil, CeraVe. Can use Lever 2000 or Cetaphil antibacterial soap  4. Use soap only where you need it. On most days, use it under your arms, between your legs, and on your feet. Let the water rinse other areas unless visibly dirty.  5. When you get out of the bath/shower, use a towel to gently blot your skin dry, don't rub it.  6. While your skin is still a little damp, apply a moisturizing cream such as Vanicream, CeraVe, Cetaphil, Eucerin, Sarna lotion or plain Vaseline Jelly. For hands apply Neutrogena Norwegian Hand Cream or Excipial Hand Cream.  7. Reapply moisturizer any time you start to itch or feel dry.  8. Sometimes using free and clear laundry detergents can be helpful. Fabric softener sheets should be avoided. Downy Free & Gentle liquid, or any liquid fabric softener that is free of dyes and perfumes, it acceptable to use  9. If your doctor has given you prescription creams you may apply moisturizers over them     Recommend daily broad spectrum sunscreen SPF 30+ to sun-exposed areas, reapply every 2 hours as needed. Call for new or changing lesions.  Staying in the shade or wearing long sleeves, sun glasses (UVA+UVB protection) and wide brim hats (4-inch brim around the entire circumference of the hat) are also recommended for sun protection.   Melanoma ABCDEs  Melanoma is the most dangerous type of skin cancer, and is the leading cause of death from skin disease.  You are more likely to develop melanoma if you: Have light-colored skin, light-colored eyes, or red or blond hair Spend a lot of time in the sun Tan regularly, either outdoors or in a tanning bed Have had blistering sunburns, especially during childhood Have a close family member who has had a melanoma Have atypical moles or large birthmarks  Early detection of melanoma is key  since treatment is typically straightforward and cure rates are extremely high if we catch it early.   The first sign of melanoma is often a change in a mole or a new dark spot.  The ABCDE system is a way of remembering the signs of melanoma.  A for asymmetry:  The two halves do not match. B for border:  The edges of the growth are irregular. C for color:  A mixture of colors are present instead of an even brown color. D for diameter:  Melanomas are usually (but not always) greater than 6mm - the size of a pencil eraser. E for evolution:  The spot keeps changing in size, shape, and color.  Please check your skin once per month between visits. You can use a small mirror in front and a large mirror behind you to keep an eye on the back side or your body.   If you see any new or changing lesions before your next follow-up, please call to schedule a visit.  Please continue daily skin protection including broad spectrum sunscreen SPF 30+ to sun-exposed areas, reapplying every 2 hours as needed when you're outdoors.    Due to recent changes in healthcare laws, you may see results of your pathology and/or laboratory studies on MyChart before the doctors have had a chance to review them. We understand that in some cases there may be results that are confusing or concerning to you. Please understand that not all  results are received at the same time and often the doctors may need to interpret multiple results in order to provide you with the best plan of care or course of treatment. Therefore, we ask that you please give us  2 business days to thoroughly review all your results before contacting the office for clarification. Should we see a critical lab result, you will be contacted sooner.   If You Need Anything After Your Visit  If you have any questions or concerns for your doctor, please call our main line at [phone] and press option 4 to reach your doctor's medical assistant. If no one answers,  please leave a voicemail as directed and we will return your call as soon as possible. Messages left after 4 pm will be answered the following business day.   You may also send us  a message via MyChart. We typically respond to MyChart messages within 1-2 business days.  For prescription refills, please ask your pharmacy to contact our office. Our fax number is [phone].  If you have an urgent issue when the clinic is closed that cannot wait until the next business day, you can page your doctor at the number below.    Please note that while we do our best to be available for urgent issues outside of office hours, we are not available 24/7.   If you have an urgent issue and are unable to reach us , you may choose to seek medical care at your doctor's office, retail clinic, urgent care center, or emergency room.  If you have a medical emergency, please immediately call 911 or go to the emergency department.  Pager Numbers  - Dr. Hester: [phone]  - Dr. Jackquline: [phone]  - Dr. Claudene: [phone]   - Dr. Raymund: [phone]  In the event of inclement weather, please call our main line at [phone] for an update on the status of any delays or closures.  Dermatology Medication Tips: Please keep the boxes that topical medications come in in order to help keep track of the instructions about where and how to use these. Pharmacies typically print the medication instructions only on the boxes and not directly on the medication tubes.   If your medication is too expensive, please contact our office at [phone] option 4 or send us  a message through MyChart.   We are unable to tell what your co-pay for medications will be in advance as this is different depending on your insurance coverage. However, we may be able to find a substitute medication at lower cost or fill out paperwork to get insurance to cover a needed medication.   If a prior authorization is required to get  your medication covered by your insurance company, please allow us  1-2 business days to complete this process.  Drug prices often vary depending on where the prescription is filled and some pharmacies may offer cheaper prices.  The website www.goodrx.com contains coupons for medications through different pharmacies. The prices here do not account for what the cost may be with help from insurance (it may be cheaper with your insurance), but the website can give you the price if you did not use any insurance.  - You can print the associated coupon and take it with your prescription to the pharmacy.  - You may also stop by our office during regular business hours and pick up a GoodRx coupon card.  - If you need your prescription sent electronically to a different pharmacy, notify our office through [facility]  MyChart or by phone at [phone] option 4.     Si Usted Necesita Algo Despus de Su Visita  Tambin puede enviarnos un mensaje a travs de Clinical Cytogeneticist. Por lo general respondemos a los mensajes de MyChart en el transcurso de 1 a 2 das hbiles.  Para renovar recetas, por favor pida a su farmacia que se ponga en contacto con nuestra oficina. [place] de fax es [place] [phone].  Si tiene un asunto urgente cuando la clnica est cerrada y que no puede esperar hasta el siguiente da hbil, puede llamar/localizar a su doctor(a) al nmero que aparece a continuacin.   Por favor, tenga en cuenta que aunque hacemos todo lo posible para estar disponibles para asuntos urgentes fuera del horario de [place], no estamos disponibles las 24 horas del da, los 7 [address]  [address] de la [place].   Si tiene un problema urgente y no puede comunicarse con nosotros, puede optar por buscar atencin mdica  en el consultorio de su doctor(a), en una clnica privada, en un centro de atencin urgente o en una sala de emergencias.  Si tiene engineer, drilling, por favor llame inmediatamente al 911 o vaya a la sala de  emergencias.  Nmeros de bper  - Dr. Hester: [phone]  - Dra. Jackquline: [phone]  - Dr. Claudene: [phone]  - Dra. Kitts: [phone]  En caso de inclemencias del [place], por favor llame a nuestra lnea principal al [phone] para una actualizacin sobre el estado de cualquier retraso o cierre.  Consejos para la medicacin en dermatologa: Por favor, guarde las cajas en las que vienen los medicamentos de uso tpico para ayudarle a seguir las instrucciones sobre dnde y cmo usarlos. Las farmacias generalmente imprimen las instrucciones del medicamento slo en las cajas y no directamente en los tubos del [place].   Si su medicamento es muy caro, por favor, pngase en contacto con landry rieger llamando al [phone] y presione la opcin 4 o envenos un mensaje a travs de Clinical Cytogeneticist.   No podemos decirle cul ser su copago por los medicamentos por adelantado ya que esto es diferente dependiendo de la cobertura de su seguro. Sin embargo, es posible que podamos encontrar un medicamento sustituto a audiological scientist un formulario para que el seguro cubra el medicamento que se considera necesario.   Si se requiere una autorizacin previa para que su compaa de seguros cubra su medicamento, por favor permtanos de 1 a 2 das hbiles para completar este proceso.  Los precios de los medicamentos varan con frecuencia dependiendo del environmental consultant de dnde se surte la receta y alguna farmacias pueden ofrecer precios ms baratos.  El sitio web www.goodrx.com tiene cupones para medicamentos de health and safety inspector. Los precios aqu no tienen en cuenta lo que podra costar con la ayuda del seguro (puede ser ms barato con su seguro), pero el sitio web puede darle el precio si no utiliz tourist information centre manager.  - Puede imprimir el cupn correspondiente y llevarlo con su receta a la farmacia.  - Tambin puede pasar por nuestra oficina durante el horario de atencin regular y education officer, museum una tarjeta  de cupones de GoodRx.  - Si necesita que su receta se enve electrnicamente a una farmacia diferente, informe a nuestra oficina a travs de MyChart de [place] o por telfono llamando al [phone] y presione la opcin 4.

## 2024-06-14 ENCOUNTER — Ambulatory Visit: Admitting: Dermatology

## 2025-03-28 ENCOUNTER — Ambulatory Visit: Admitting: Dermatology
